# Patient Record
Sex: Female | Born: 1950 | Race: Black or African American | Hispanic: No | Marital: Married | Smoking: Current every day smoker
Health system: Southern US, Community
[De-identification: ages and names within clinical notes are randomized; demographics above are authoritative.]

## PROBLEM LIST (undated history)

## (undated) DIAGNOSIS — I10 Essential (primary) hypertension: Secondary | ICD-10-CM

## (undated) DIAGNOSIS — M1990 Unspecified osteoarthritis, unspecified site: Secondary | ICD-10-CM

## (undated) DIAGNOSIS — J302 Other seasonal allergic rhinitis: Secondary | ICD-10-CM

## (undated) HISTORY — PX: CARPAL TUNNEL RELEASE: SHX101

## (undated) HISTORY — PX: LUMBAR FUSION: SHX111

## (undated) HISTORY — PX: BACK SURGERY: SHX140

## (undated) HISTORY — PX: HAND TENDON SURGERY: SHX663

## (undated) HISTORY — PX: BRONCHOSCOPY: SUR163

## (undated) HISTORY — PX: ABDOMINAL HYSTERECTOMY: SHX81

## (undated) HISTORY — PX: COLONOSCOPY: SHX174

---

## 1993-03-23 HISTORY — PX: THORACOTOMY: SUR1349

## 2002-08-24 ENCOUNTER — Other Ambulatory Visit: Admission: RE | Admit: 2002-08-24 | Discharge: 2002-08-24 | Payer: Self-pay | Admitting: Unknown Physician Specialty

## 2005-01-20 ENCOUNTER — Encounter: Admission: RE | Admit: 2005-01-20 | Discharge: 2005-01-20 | Payer: Self-pay | Admitting: Neurological Surgery

## 2005-03-19 ENCOUNTER — Inpatient Hospital Stay (HOSPITAL_COMMUNITY): Admission: RE | Admit: 2005-03-19 | Discharge: 2005-03-21 | Payer: Self-pay | Admitting: Neurological Surgery

## 2005-04-03 ENCOUNTER — Encounter: Admission: RE | Admit: 2005-04-03 | Discharge: 2005-04-03 | Payer: Self-pay | Admitting: Neurological Surgery

## 2005-04-06 ENCOUNTER — Ambulatory Visit (HOSPITAL_COMMUNITY): Admission: RE | Admit: 2005-04-06 | Discharge: 2005-04-07 | Payer: Self-pay | Admitting: Neurological Surgery

## 2005-05-26 ENCOUNTER — Encounter: Admission: RE | Admit: 2005-05-26 | Discharge: 2005-05-26 | Payer: Self-pay | Admitting: Neurological Surgery

## 2006-12-06 ENCOUNTER — Encounter: Admission: RE | Admit: 2006-12-06 | Discharge: 2006-12-06 | Payer: Self-pay | Admitting: Neurological Surgery

## 2008-01-27 IMAGING — CR DG LUMBAR SPINE 2-3V
3 series · 3 of 3 positions shown · non-contrast
Comparison: CT of the lumbar spine of 04-03-05.

CLINICAL DATA: Status post fusion on [DATE].  Low back pain. 
 LUMBAR SPINE ? THREE VIEWS:

[view not recorded (1 of 3)]
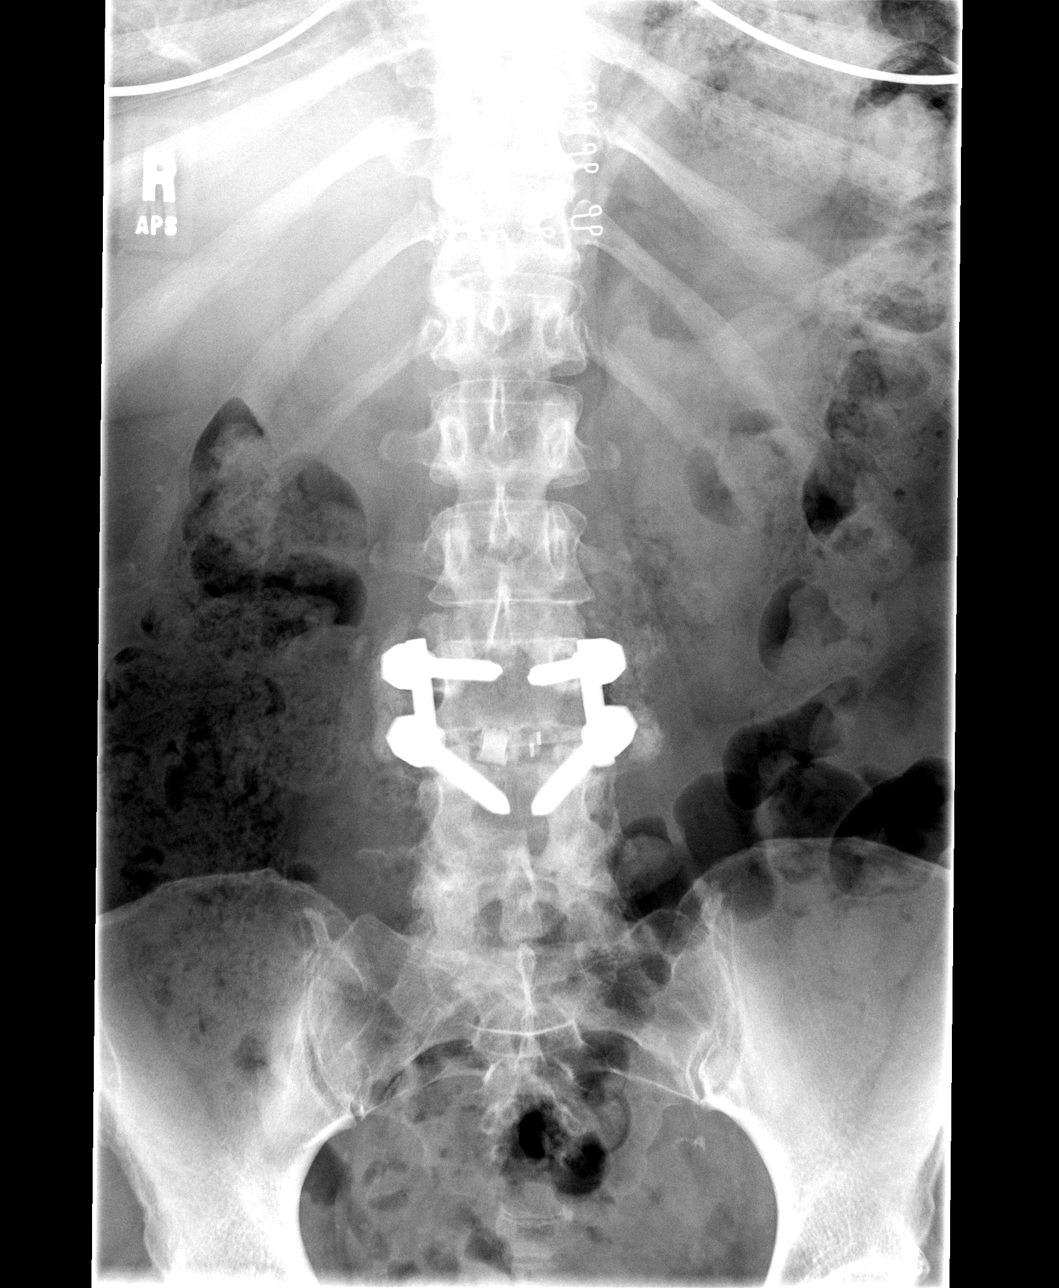

[view not recorded (2 of 3)]
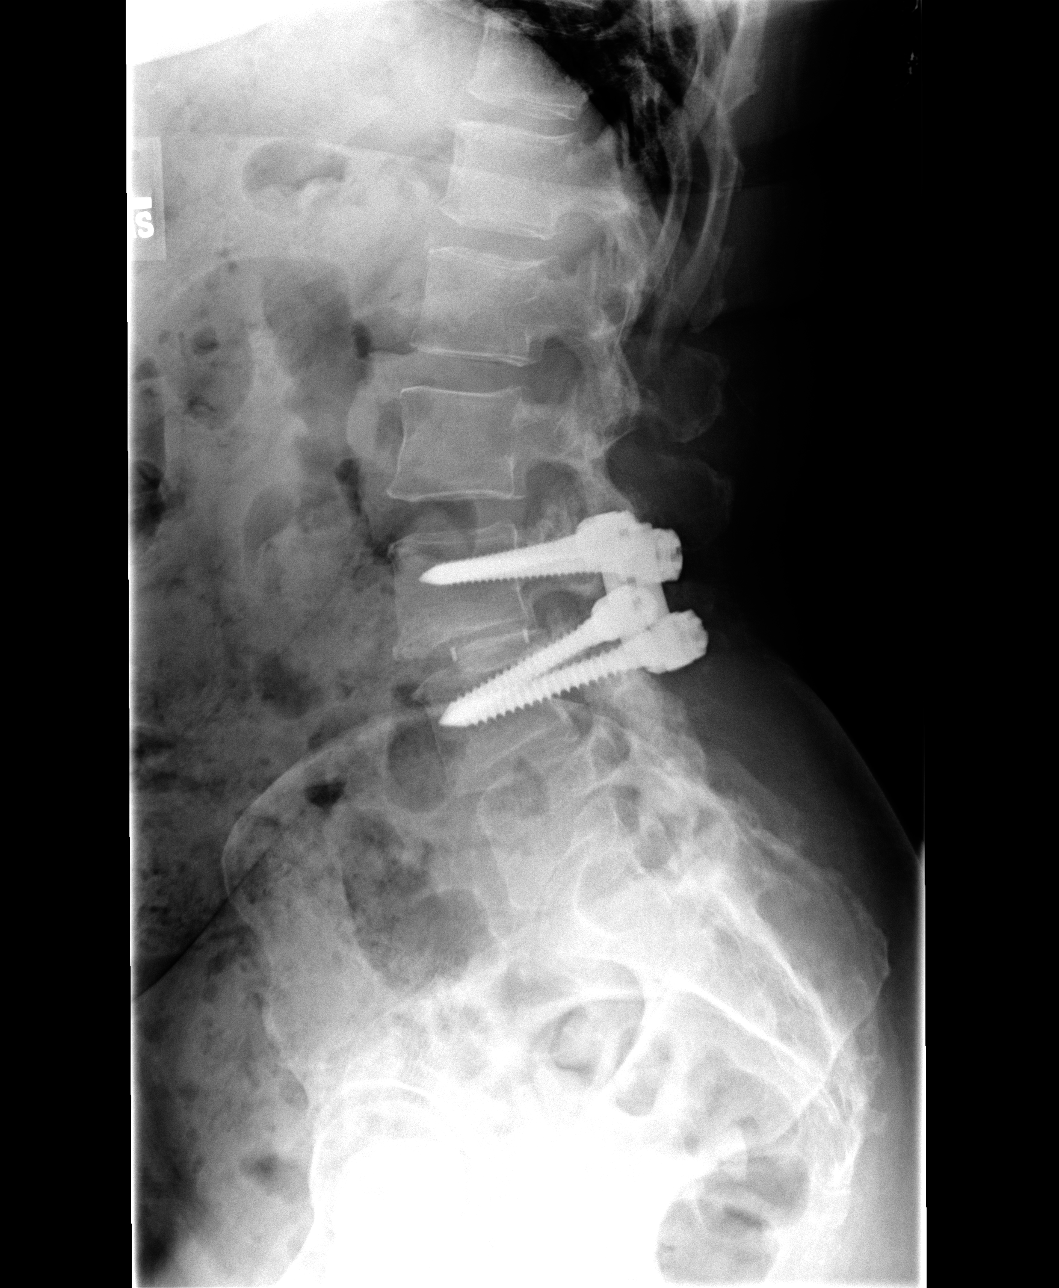

[view not recorded (3 of 3)]
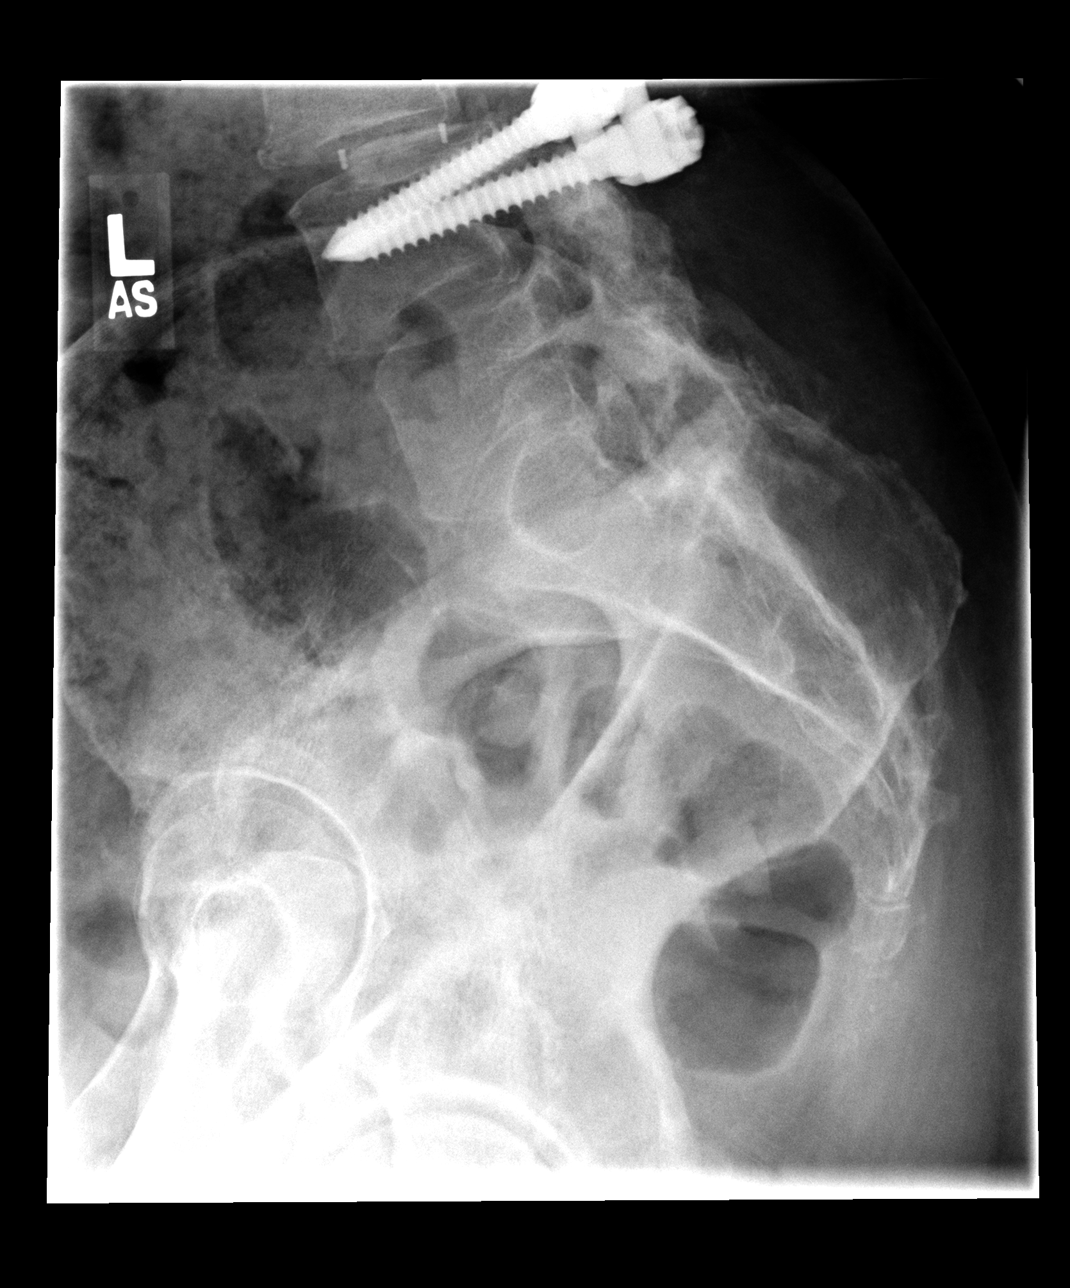

[3 of 3 positions shown; findings below may reference images not displayed]

FINDINGS: The patient is status post L3-4 posterior fixation.  The patient has also undergone prior L4-5 fixation. There is interbody graft material at L3-4.  There is no hardware complication identified.  Alignment is unchanged.
IMPRESSION: Stable plain films since 04-03-05.  The abnormality described on CT is not apparent by plain films.  No evidence of hardware loosening or other complication.

## 2009-08-08 IMAGING — CR DG LUMBAR SPINE 2-3V
3 series · 3 of 3 positions shown · non-contrast
Comparison: 05/26/05

CLINICAL DATA: Posterior lumbar interbody fusion with low back and left hip pain.  Left leg pain.  
 LUMBAR SPINE - 2 VIEW:

[view not recorded (1 of 3)]
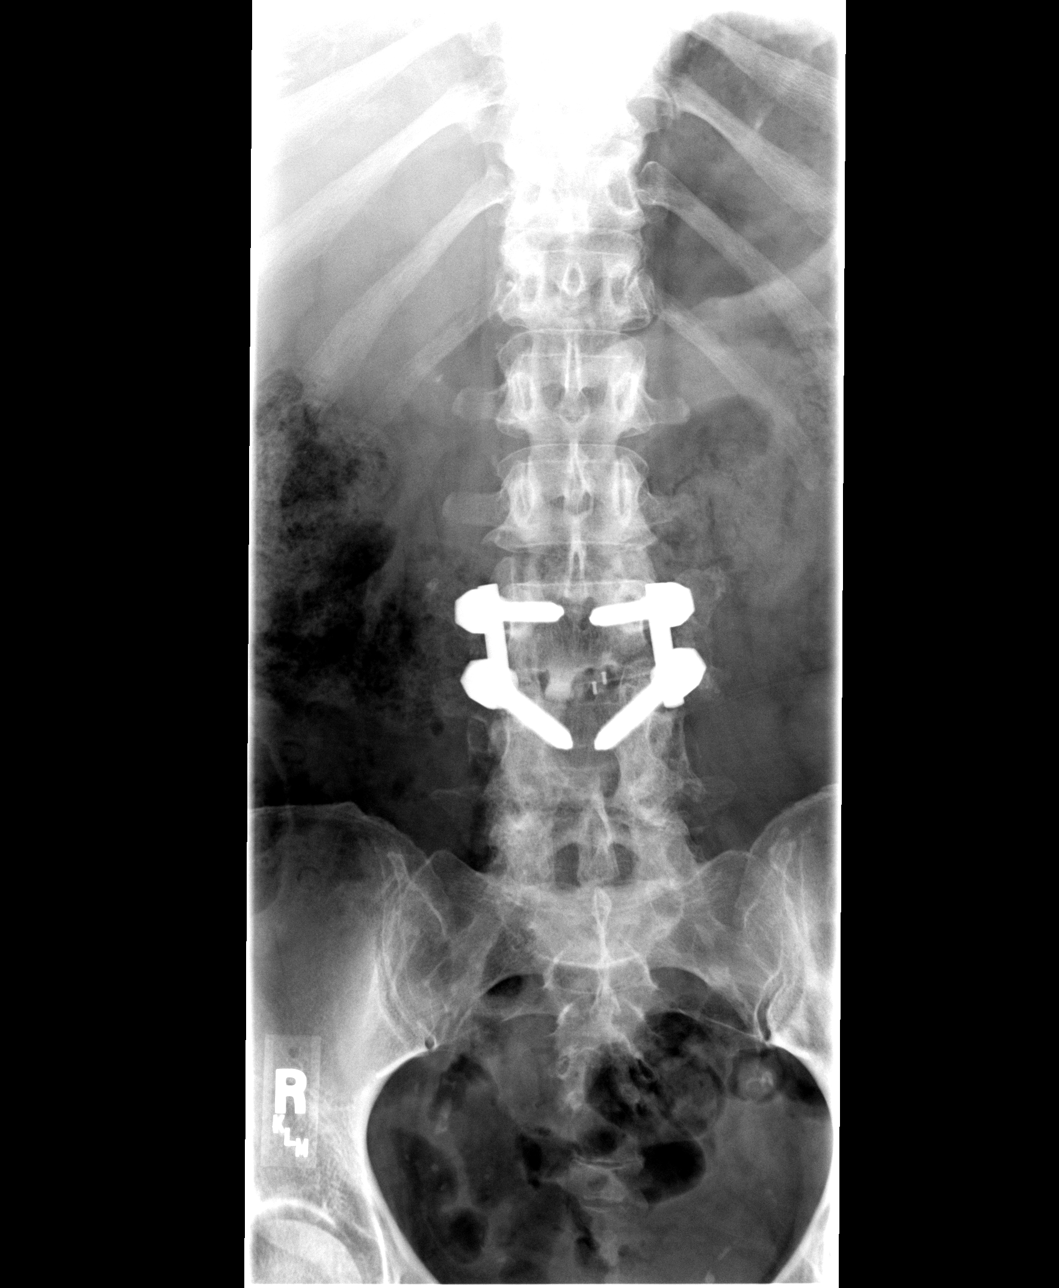

[view not recorded (2 of 3)]
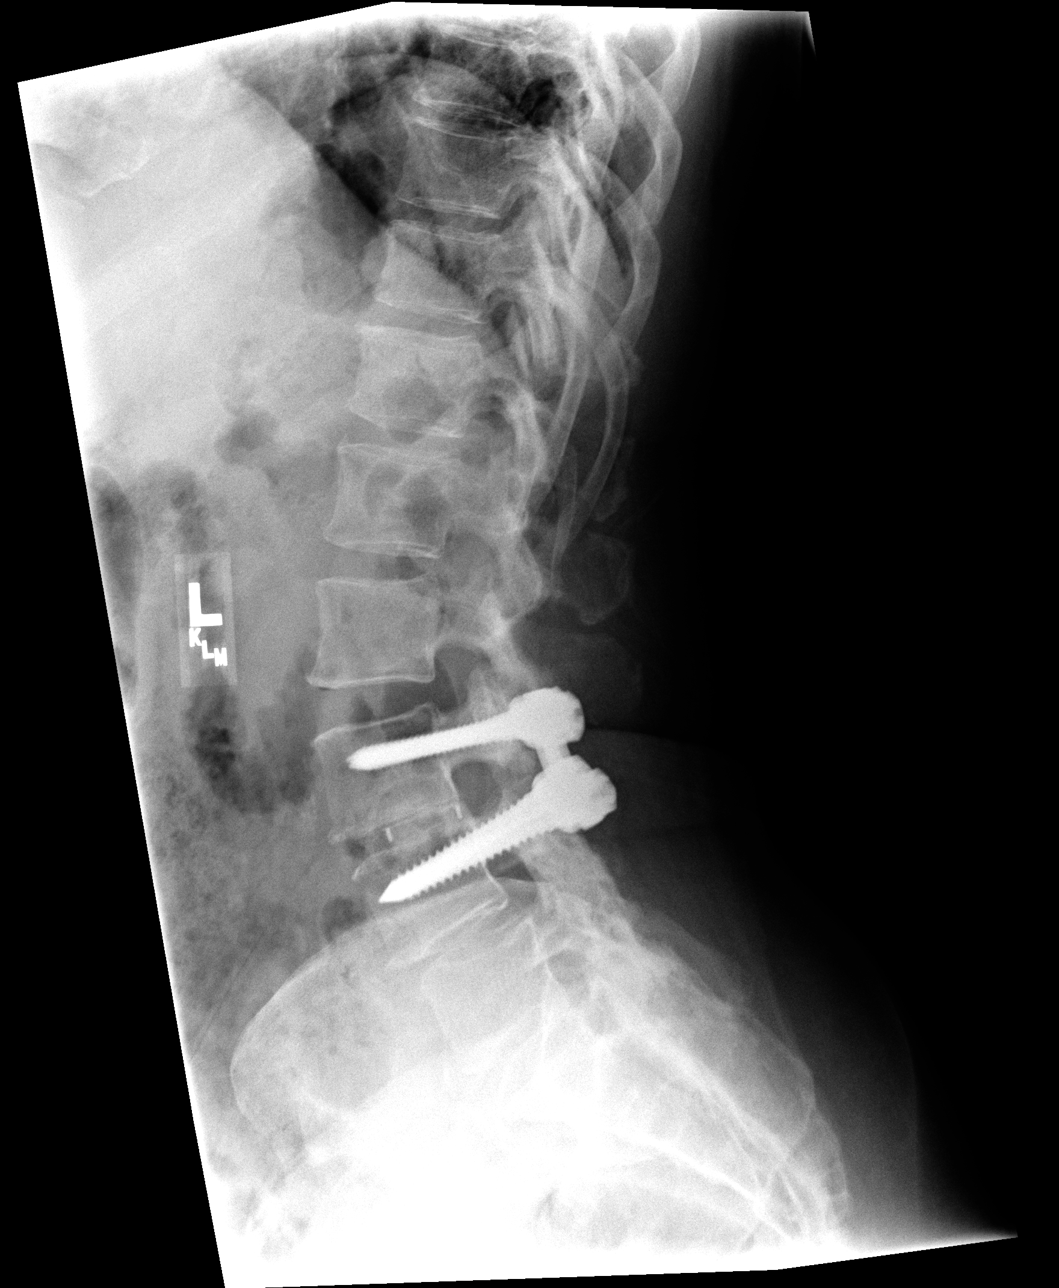

[view not recorded (3 of 3)]
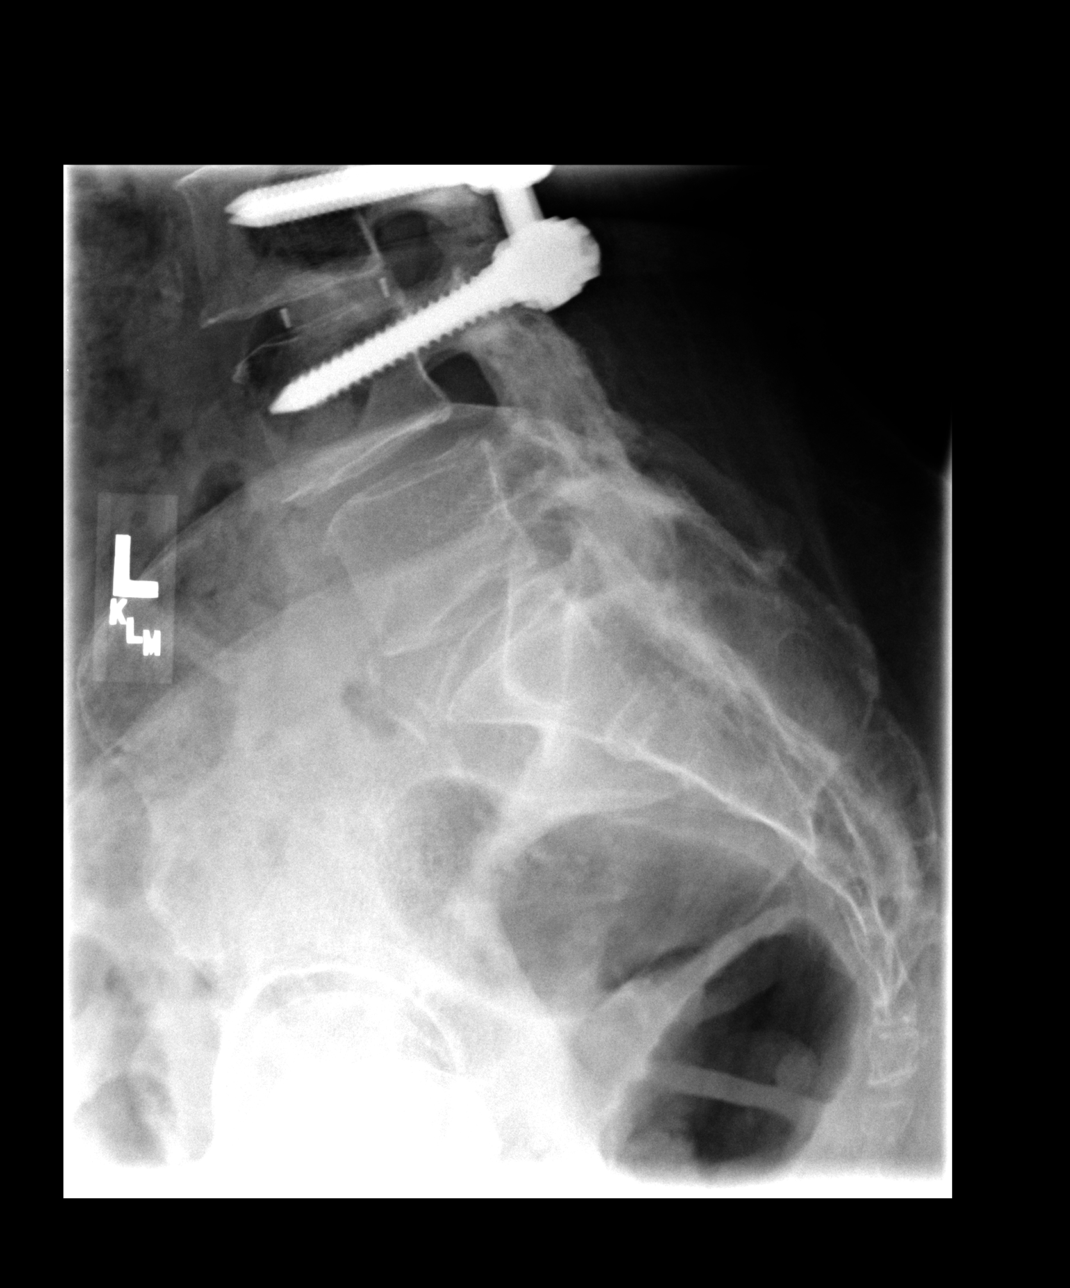

[3 of 3 positions shown; findings below may reference images not displayed]

FINDINGS: The patient is status post L3 - L4 posterior lumbar interbody fusion with interbody strut device.  There is lucency about the superior aspect of the interbody graft material.  Alignment of the lumbar spine is unchanged from 04/03/05.  There is loss of disk space height at L4-5.  Endplate degenerative change elsewhere.
IMPRESSION: 1.  L3 - L4 PLIF with lucency along the superior margin of the interbody graft material.  
 2.  Spondylosis.

## 2010-08-08 NOTE — Op Note (Signed)
Linda Davis, Linda Davis             ACCOUNT NO.:  [id number]   MEDICAL RECORD NO.:  [id number]          PATIENT TYPE:  INP   LOCATION:  2860                         FACILITY:  [facility]   PHYSICIAN:  Tia Alert, MD     DATE OF BIRTH:  Aug 22, 1950   DATE OF PROCEDURE:  03/19/2005  DATE OF DISCHARGE:                                 OPERATIVE REPORT   PREOPERATIVE DIAGNOSIS:  Adjacent level degeneration, L3-4, with segmental  instability and lumbar spinal stenosis, with back and leg pain.   POSTOPERATIVE DIAGNOSIS:  Adjacent level degeneration, L3-4, with segmental  instability and lumbar spinal stenosis, with back and leg pain.   PROCEDURES:  1.  Lumbar re-exploration with exploration of fusion, L4-5, with removal of      nonsegmental instrumentation, L4-5.  2.  Decompressive lumbar laminectomy, hemifacetectomy and foraminotomies at      L3-4 for L3 and L4 nerve root decompression.  3.  Posterior lumbar interbody fusion, L3-4, utilizing 9 mm interbody cage      packed with local autograft and Vitoss soaked with bone marrow aspirate      obtained through a separate stab incision and an interbody allograft at      L3-4.  4.  Intertransverse arthrodesis, L3-4, utilizing a mixture of Vitoss soaked      with bone marrow aspirate and local autograft.  5.  Nonsegmental fixation, L3-4, utilizing the USS pedicle screw system,      with replacement of hardware, L3-4.   SURGEON:  Tia Alert, M.D.   ASSISTANT:  Reinaldo Meeker, M.D.   ANESTHESIA:  General endotracheal.   COMPLICATIONS:  None apparent.   INDICATIONS FOR PROCEDURE:  Ms. Killough is a 60 year old black female who  had undergone an instrumented fusion at L4-5 at [facility] about five  years ago.  Postoperatively she developed significant back pain with left  leg pain in an L4 distribution.  She has had extensive workup at [facility] and at pain management centers and been treated with pain  medicines for  several years without significant relief.  I got a CT  myelogram with flexion-extension views that showed segmental instability at  L3-4, the level adjacent to the fusion, with adjacent-level stenosis at L3-4  and compression of the left L4 nerve root.  I recommended lumbar re-  exploration with removal of the hardware at L4-5, a PLIF at L3-4 with  replacement of hardware at L3-4.  She understood the risks, benefits and  expected outcome of the procedure and wished to proceed.   DESCRIPTION OF PROCEDURE:  The patient was taken to operating room and after  induction of adequate generalized endotracheal anesthesia, she was rolled  into the prone position on chest rolls and all pressure points were padded.  Her lumbar region was prepped with DuraPrep and then draped in the usual  sterile fashion.  Local anesthesia 10 mL was then injected and then a dorsal  midline incision was made through the old incision and carried down through  the lumbosacral fascia.  The fascia was opened and the paraspinous  musculature was  taken down in a subperiosteal fashion to expose L3-4 and L4-  5.  The old hardware was found as I dissected out over the facets at L3-4  and identified the transverse processes of L3 and L4 and identified the  heads of pedicle screws at L4 and L5.  I then was able to remove the locking  caps and the sleeves from the surface of the pedicle screws at L4 and L5.  These pedicle screws were quite tight in the pedicles and seen to be very  solid.  I explored the fusion at L4-5 and found it to be solid.  It moved as  one unit.  I then was able to remove the soft tissues from the L3 lamina and  used a Kerrison punch and a Leksell rongeur to perform a complete  laminectomy, hemifacetectomy and foraminotomy at L3-4.  The L3 and L4 nerve  roots were identified and decompressed into their respective foramina.  The  epidural venous vasculature was coagulated L3-4 and the disk space was  incised  at L3-4 bilaterally, and the initial diskectomy was done with  pituitary rongeurs.  The disk space was then distracted to 9 mm and the  endplates were prepared on both sides utilizing rotating cutters and  curettes.  Once the endplates were prepared, we packed a 9 x 22 mm PEEK  interbody cage with local autograft and Vitoss which was soaked with a bone  marrow aspirate, which was obtained through a separate fascial incision over  the right iliac crest.  The cage was packed and tapped into position at L3-4  on the patient's right side.  The midline was then packed with local  autograft and Vitoss soaked with bone marrow aspirate and a 9 mm allograft  was tapped down on the patient's left side.  Once the PLIF was completed, we  localized the pedicle screw entry zones at L3.  We probed each pedicle under  fluoroscopic guidance, tapped each pedicle with a 5.0 tap and placed 6.00 x  40 mm pedicle screws into the pedicles of L3 bilaterally.  We took out the  L5 pedicle screws bilaterally and then were able to slide the rod into the  pedicle screws, place the sleeve on top of the rod and connect the locking  nut.  These nuts were tightened with a hand tightener at L3 and L4  bilaterally.  We then decorticated the transverse 5 processes of L3 and L4  bilaterally and placed a mixture of Vitoss soaked with bone marrow aspirate  and local autograft out over these,  performing intertransverse arthrodesis  at L3-4 bilaterally.  We irrigated with copious amounts of bacitracin-  containing saline solution, placed a medium Hemovac drain through a separate  stab incision, lined the dura with Gelfoam, achieved hemostasis and then  closed the muscle and the fascia with interrupted #1 Vicryl and closed the  subcutaneous and subcuticular tissues with 200 and 3-0 Vicryl and closed the  skin with Benzoin and Steri-Strips.  The drapes were removed, a sterile dressing was applied, and the patient was awakened from  general anesthesia  and transported to the recovery room in stable condition.  At the end of the  procedure all sponge, needle and instrument counts were correct.      Tia Alert, MD  Electronically Signed     DSJ/MEDQ  D:  03/19/2005  T:  03/20/2005  Job:  [id number]

## 2010-08-08 NOTE — Op Note (Signed)
NAMEKYNSLEIGH, WESTENDORF             ACCOUNT NO.:  [id number]   MEDICAL RECORD NO.:  [id number]          PATIENT TYPE:  OIB   LOCATION:  3001                         FACILITY:  [facility]   PHYSICIAN:  Tia Alert, MD     DATE OF BIRTH:  12-04-1950   DATE OF PROCEDURE:  04/06/2005  DATE OF DISCHARGE:                                 OPERATIVE REPORT   PREOPERATIVE DIAGNOSIS:  Left L3 radiculopathy from bone graft compression  in the neural foramen L3-4 on the left, status post L3-4 PLIF.   POSTOPERATIVE DIAGNOSIS:  Left L3 radiculopathy from bone graft compression  in the neural foramen L3-4 on the left, status post L3-4 PLIF.   PROCEDURE:  Lumbar re-exploration with exploration of L3-4 fusion with  repeat foraminotomy and extension of hemi-facetectomy at L3-4 on the left  with decompression of the left L3 nerve root.   SURGEON:  Marikay Alar, M.D.   ASSISTANT:  Reinaldo Meeker, M.D.   ANESTHESIA:  General tracheal.   COMPLICATIONS:  None apparent.   INDICATIONS FOR PROCEDURE:  Ms. Fosco is a 60 year old female who  underwent a posterior lumbar antibody fusion about 2-1/2 weeks ago.  She  presented with severe left leg pain and following the L3 distribution.  I  thought a plane film showed a density in the foramen and therefore ordered a  CT scan.  This showed what looked like of pieces of bone graft in the  foramen at L3-4 on the left side lateral to the pedicle screw construct  suggestive of a bone graft laid out during the intertransverse arthrodesis  that had migrated into the foramen of the left L3 nerve root.  This seemed  to match her symptomatology.  The radiologist felt that the foramen was  opened.  Despite this, however, I felt that it matched her symptomatology.  I recommended lumbar re-exploration with exploration of the foramen with  decompression of the left L3 nerve root.  She understood the risks,  benefits, expected outcome and wished to proceed.   FINDINGS  AT OPERATION:  VITOSS allograft bone matrix lateral to the pedicle  screw and rod construct in the extraforaminal space, compressing the left L3  nerve root.   DESCRIPTION OF PROCEDURE:  The patient was taken to the operating room and  after induction of adequate generalized endotracheal anesthesia, she was  rolled in the prone position on the Wilson frame, and all pressure points  were padded.  The lumbar region was prepped with DuraPrep and then draped in  the usual sterile fashion; 5 mL of  local anesthesia was injected, and then  an incision was made through the upper half of the old incision and carried  down to the fascia.  The old sutures were cut, and the fascia was opened,  and the hardware was identified.  The hardware was well placed and was  solid.  I was able to suck away the old Gelfoam and some scar tissue from  the surface of the dura and identify the left L3 and L4 nerve roots.  I  identified the peak of the  interbody graft.  I was able to remove some disk  space and some disk from the disk space lateral to the peak graft to  decompress the L3 nerve root from underneath.  I was then able to follow the  L3 nerve root into the foramen.  I was able to use a Kerrison punch to widen  the hemi-facetectomy and follow the nerve out under the pedicle screw and  rod construct.  There I found a significant amount of VITOSS which had come  down from the intertransverse space during packing of disk space to perform  intertransverse arthrodesis during the previous surgery.  This was  compressing the L3 nerve root significantly.  I was able to use a nerve hook  to remove this and remove several fragments of VITOSS from the surface of  the nerve root.  I continued to follow the nerve root out into the foramen  until it was well decompressed.  I then continued to remove pieces of the  VITOSS from underneath the rod and pedicle screw construct to make sure  there was a significant space at  least 2 cm between the nerve root and the  VITOSS superiorly.  I felt I had a good decompression of the nerve root.  I  then irrigated with saline solution containing bacitracin, inspected the  nerve root once again, put a nerve hook and a coronary dilator.  I inspected  my rod construct once again.  I then lined the dura with Gelfoam and then  closed the fascia with interrupted #1 Vicryl and closed the subcutaneous and  subcuticular tissues with 3-0 Vicryl and closed the skin with Benzoin Steri-  Strips.  The drapes were removed. Sterile dressing was applied.  The patient  was awakened from anesthesia and transferred to the recovery room in stable  condition.  At the end of procedure, all sponge, needle and sponge counts  were correct.      Tia Alert, MD  Electronically Signed     DSJ/MEDQ  D:  04/06/2005  T:  04/07/2005  Job:  [id number]

## 2011-06-25 ENCOUNTER — Encounter (HOSPITAL_BASED_OUTPATIENT_CLINIC_OR_DEPARTMENT_OTHER)
Admission: RE | Admit: 2011-06-25 | Discharge: 2011-06-25 | Disposition: A | Discharge status: Home / Self Care | Payer: Medicare Other | Source: Ambulatory Visit | Attending: Orthopedic Surgery | Admitting: Orthopedic Surgery

## 2011-06-25 ENCOUNTER — Encounter (HOSPITAL_BASED_OUTPATIENT_CLINIC_OR_DEPARTMENT_OTHER): Payer: Self-pay | Admitting: *Deleted

## 2011-06-25 NOTE — Progress Notes (Signed)
Came from office-too early to do lab-ekg done-will do istat dos-lives past [place]. Sees a cardiologist yearly only due to htn and diabetic-will call for records

## 2011-06-26 ENCOUNTER — Other Ambulatory Visit: Payer: Self-pay | Admitting: Orthopedic Surgery

## 2011-07-01 ENCOUNTER — Encounter (HOSPITAL_BASED_OUTPATIENT_CLINIC_OR_DEPARTMENT_OTHER): Payer: Self-pay | Admitting: Certified Registered Nurse Anesthetist

## 2011-07-01 ENCOUNTER — Ambulatory Visit (HOSPITAL_BASED_OUTPATIENT_CLINIC_OR_DEPARTMENT_OTHER): Payer: Medicare Other | Admitting: Certified Registered Nurse Anesthetist

## 2011-07-01 ENCOUNTER — Encounter (HOSPITAL_BASED_OUTPATIENT_CLINIC_OR_DEPARTMENT_OTHER): Payer: Self-pay | Admitting: Orthopedic Surgery

## 2011-07-01 ENCOUNTER — Encounter (HOSPITAL_BASED_OUTPATIENT_CLINIC_OR_DEPARTMENT_OTHER)
Admission: RE | Disposition: A | Discharge status: Home / Self Care | Payer: Self-pay | Source: Ambulatory Visit | Attending: Orthopedic Surgery

## 2011-07-01 ENCOUNTER — Ambulatory Visit (HOSPITAL_BASED_OUTPATIENT_CLINIC_OR_DEPARTMENT_OTHER)
Admission: RE | Admit: 2011-07-01 | Discharge: 2011-07-01 | Disposition: A | Discharge status: Home / Self Care | Payer: Medicare Other | Source: Ambulatory Visit | Attending: Orthopedic Surgery | Admitting: Orthopedic Surgery

## 2011-07-01 DIAGNOSIS — M9380 Other specified osteochondropathies of unspecified site: Secondary | ICD-10-CM | POA: Insufficient documentation

## 2011-07-01 DIAGNOSIS — E119 Type 2 diabetes mellitus without complications: Secondary | ICD-10-CM | POA: Insufficient documentation

## 2011-07-01 DIAGNOSIS — Z01810 Encounter for preprocedural cardiovascular examination: Secondary | ICD-10-CM | POA: Insufficient documentation

## 2011-07-01 DIAGNOSIS — I10 Essential (primary) hypertension: Secondary | ICD-10-CM | POA: Insufficient documentation

## 2011-07-01 HISTORY — DX: Essential (primary) hypertension: I10

## 2011-07-01 HISTORY — PX: CARPECTOMY: SHX5004

## 2011-07-01 HISTORY — DX: Unspecified osteoarthritis, unspecified site: M19.90

## 2011-07-01 HISTORY — DX: Other seasonal allergic rhinitis: J30.2

## 2011-07-01 LAB — POCT I-STAT, CHEM 8
Calcium, Ion: 1.24 mmol/L (ref 1.12–1.32)
Creatinine, Ser: 0.8 mg/dL (ref 0.50–1.10)
Glucose, Bld: 227 mg/dL — ABNORMAL HIGH (ref 70–99)
HCT: 49 % — ABNORMAL HIGH (ref 36.0–46.0)
Hemoglobin: 16.7 g/dL — ABNORMAL HIGH (ref 12.0–15.0)
TCO2: 26 mmol/L (ref 0–100)

## 2011-07-01 SURGERY — CARPECTOMY
Anesthesia: General | Site: Wrist | Laterality: Left | Wound class: Clean

## 2011-07-01 MED ORDER — METOCLOPRAMIDE HCL 5 MG/ML IJ SOLN: 10.0000 mg | Freq: Once | INTRAMUSCULAR | Status: DC | PRN

## 2011-07-01 MED ORDER — LACTATED RINGERS IV SOLN
INTRAVENOUS | Status: DC
  Administered 2011-07-01 (×2): via INTRAVENOUS

## 2011-07-01 MED ORDER — CHLORHEXIDINE GLUCONATE 4 % EX LIQD: 60.0000 mL | Freq: Once | CUTANEOUS | Status: DC

## 2011-07-01 MED ORDER — LIDOCAINE-EPINEPHRINE 1.5-1:200000 % IJ SOLN
INTRAMUSCULAR | Status: DC | PRN
  Administered 2011-07-01: 20 mL via INTRADERMAL

## 2011-07-01 MED ORDER — LIDOCAINE HCL 1 % IJ SOLN
INTRAMUSCULAR | Status: DC | PRN
  Administered 2011-07-01: 3 mL via INTRADERMAL

## 2011-07-01 MED ORDER — FENTANYL CITRATE 0.05 MG/ML IJ SOLN
50.0000 ug | INTRAMUSCULAR | Status: DC | PRN
  Administered 2011-07-01: 100 ug via INTRAVENOUS

## 2011-07-01 MED ORDER — ROPIVACAINE HCL 5 MG/ML IJ SOLN
INTRAMUSCULAR | Status: DC | PRN
  Administered 2011-07-01: 20 mL via EPIDURAL

## 2011-07-01 MED ORDER — DROPERIDOL 2.5 MG/ML IJ SOLN
INTRAMUSCULAR | Status: DC | PRN
  Administered 2011-07-01: 0.625 mg via INTRAVENOUS

## 2011-07-01 MED ORDER — PROPOFOL 10 MG/ML IV EMUL
INTRAVENOUS | Status: DC | PRN
  Administered 2011-07-01: 240 mg via INTRAVENOUS

## 2011-07-01 MED ORDER — FENTANYL CITRATE 0.05 MG/ML IJ SOLN: 25.0000 ug | INTRAMUSCULAR | Status: DC | PRN

## 2011-07-01 MED ORDER — METOCLOPRAMIDE HCL 5 MG/ML IJ SOLN
INTRAMUSCULAR | Status: DC | PRN
  Administered 2011-07-01 (×2): 10 mg via INTRAVENOUS

## 2011-07-01 MED ORDER — LIDOCAINE HCL (CARDIAC) 20 MG/ML IV SOLN
INTRAVENOUS | Status: DC | PRN
  Administered 2011-07-01: 60 mg via INTRAVENOUS

## 2011-07-01 MED ORDER — MIDAZOLAM HCL 2 MG/2ML IJ SOLN
0.5000 mg | INTRAMUSCULAR | Status: DC | PRN
  Administered 2011-07-01: 2 mg via INTRAVENOUS

## 2011-07-01 MED ORDER — EPHEDRINE SULFATE 50 MG/ML IJ SOLN
INTRAMUSCULAR | Status: DC | PRN
  Administered 2011-07-01: 10 mg via INTRAVENOUS

## 2011-07-01 MED ORDER — CEFAZOLIN SODIUM 1-5 GM-% IV SOLN
1.0000 g | INTRAVENOUS | Status: AC
  Administered 2011-07-01: 2 g via INTRAVENOUS

## 2011-07-01 MED ORDER — PENTAZOCINE-NALOXONE 50-0.5 MG PO TABS: 1.0000 | ORAL_TABLET | ORAL | Status: AC | PRN

## 2011-07-01 MED ORDER — ONDANSETRON HCL 4 MG/2ML IJ SOLN
INTRAMUSCULAR | Status: DC | PRN
  Administered 2011-07-01: 4 mg via INTRAVENOUS

## 2011-07-01 SURGICAL SUPPLY — 55 items
BANDAGE GAUZE ELAST BULKY 4 IN (GAUZE/BANDAGES/DRESSINGS) ×2 IMPLANT
BLADE ARTHRO LOK 4 BEAVER (BLADE) ×2 IMPLANT
BLADE EAR TYMPAN 2.5 60D BEAV (BLADE) IMPLANT
BLADE MINI RND TIP GREEN BEAV (BLADE) ×2 IMPLANT
BLADE SURG 15 STRL LF DISP TIS (BLADE) ×1 IMPLANT
BLADE SURG 15 STRL SS (BLADE) ×2
BNDG CMPR 9X4 STRL LF SNTH (GAUZE/BANDAGES/DRESSINGS) ×1
BNDG COHESIVE 3X5 TAN STRL LF (GAUZE/BANDAGES/DRESSINGS) ×2 IMPLANT
BNDG ESMARK 4X9 LF (GAUZE/BANDAGES/DRESSINGS) ×2 IMPLANT
CHLORAPREP W/TINT 26ML (MISCELLANEOUS) ×2 IMPLANT
CLOTH BEACON ORANGE TIMEOUT ST (SAFETY) ×2 IMPLANT
CORDS BIPOLAR (ELECTRODE) ×2 IMPLANT
COVER MAYO STAND STRL (DRAPES) ×2 IMPLANT
COVER TABLE BACK 60X90 (DRAPES) ×2 IMPLANT
CUFF TOURNIQUET SINGLE 18IN (TOURNIQUET CUFF) ×1 IMPLANT
DECANTER SPIKE VIAL GLASS SM (MISCELLANEOUS) IMPLANT
DRAPE EXTREMITY T 121X128X90 (DRAPE) ×2 IMPLANT
DRAPE OEC MINIVIEW 54X84 (DRAPES) ×2 IMPLANT
DRAPE SURG 17X23 STRL (DRAPES) ×2 IMPLANT
GAUZE XEROFORM 1X8 LF (GAUZE/BANDAGES/DRESSINGS) ×2 IMPLANT
GLOVE BIO SURGEON STRL SZ 6.5 (GLOVE) ×2 IMPLANT
GLOVE BIOGEL PI IND STRL 7.0 (GLOVE) IMPLANT
GLOVE BIOGEL PI IND STRL 8.5 (GLOVE) ×1 IMPLANT
GLOVE BIOGEL PI INDICATOR 7.0 (GLOVE) ×1
GLOVE BIOGEL PI INDICATOR 8.5 (GLOVE) ×1
GLOVE SURG ORTHO 8.0 STRL STRW (GLOVE) ×2 IMPLANT
GLOVE SURG SS PI 8.5 STRL IVOR (GLOVE)
GLOVE SURG SS PI 8.5 STRL STRW (GLOVE) ×1 IMPLANT
GOWN BRE IMP PREV XXLGXLNG (GOWN DISPOSABLE) ×2 IMPLANT
GOWN PREVENTION PLUS XLARGE (GOWN DISPOSABLE) ×2 IMPLANT
NS IRRIG 1000ML POUR BTL (IV SOLUTION) ×2 IMPLANT
PACK BASIN DAY SURGERY FS (CUSTOM PROCEDURE TRAY) ×2 IMPLANT
PAD CAST 3X4 CTTN HI CHSV (CAST SUPPLIES) ×1 IMPLANT
PADDING CAST ABS 3INX4YD NS (CAST SUPPLIES)
PADDING CAST ABS 4INX4YD NS (CAST SUPPLIES)
PADDING CAST ABS COTTON 3X4 (CAST SUPPLIES) IMPLANT
PADDING CAST ABS COTTON 4X4 ST (CAST SUPPLIES) ×1 IMPLANT
PADDING CAST COTTON 3X4 STRL (CAST SUPPLIES) ×2
SLEEVE SCD COMPRESS KNEE MED (MISCELLANEOUS) ×2 IMPLANT
SPLINT PLASTER CAST XFAST 3X15 (CAST SUPPLIES) IMPLANT
SPLINT PLASTER XTRA FASTSET 3X (CAST SUPPLIES) ×10
SPONGE GAUZE 4X4 12PLY (GAUZE/BANDAGES/DRESSINGS) ×2 IMPLANT
STOCKINETTE 4X48 STRL (DRAPES) ×2 IMPLANT
SUT MERSILENE 3 0 FS 1 (SUTURE) ×2 IMPLANT
SUT VIC AB 0 CT1 27 (SUTURE)
SUT VIC AB 0 CT1 27XBRD ANBCTR (SUTURE) IMPLANT
SUT VIC AB 2-0 SH 27 (SUTURE)
SUT VIC AB 2-0 SH 27XBRD (SUTURE) IMPLANT
SUT VICRYL 4-0 PS2 18IN ABS (SUTURE) ×2 IMPLANT
SUT VICRYL RAPIDE 4/0 PS 2 (SUTURE) ×2 IMPLANT
SYR BULB 3OZ (MISCELLANEOUS) ×2 IMPLANT
SYR CONTROL 10ML LL (SYRINGE) IMPLANT
TOWEL OR 17X24 6PK STRL BLUE (TOWEL DISPOSABLE) ×2 IMPLANT
UNDERPAD 30X30 INCONTINENT (UNDERPADS AND DIAPERS) ×2 IMPLANT
WATER STERILE IRR 1000ML POUR (IV SOLUTION) ×1 IMPLANT

## 2011-07-01 NOTE — Op Note (Signed)
Dictated 07-01-2011

## 2011-07-01 NOTE — Anesthesia Preprocedure Evaluation (Signed)
Anesthesia Evaluation  Patient identified by MRN, date of birth, ID band Patient awake    Reviewed: Allergy & Precautions, H&P , NPO status , Patient's Chart, lab work & pertinent test results, reviewed documented beta blocker date and time   Airway Mallampati: II TM Distance: >3 FB Neck ROM: full    Dental   Pulmonary neg pulmonary ROS,          Cardiovascular hypertension, On Medications     Neuro/Psych negative neurological ROS  negative psych ROS   GI/Hepatic negative GI ROS, Neg liver ROS,   Endo/Other  Diabetes mellitus-, Type 2, Oral Hypoglycemic Agents  Renal/GU negative Renal ROS  negative genitourinary   Musculoskeletal   Abdominal   Peds  Hematology negative hematology ROS (+)   Anesthesia Other Findings See surgeon's H&P   Reproductive/Obstetrics negative OB ROS                           Anesthesia Physical Anesthesia Plan  ASA: III  Anesthesia Plan: General   Post-op Pain Management:    Induction: Intravenous  Airway Management Planned: LMA  Additional Equipment:   Intra-op Plan:   Post-operative Plan: Extubation in OR  Informed Consent: I have reviewed the patients History and Physical, chart, labs and discussed the procedure including the risks, benefits and alternatives for the proposed anesthesia with the patient or authorized representative who has indicated his/her understanding and acceptance.     Plan Discussed with: CRNA and Surgeon  Anesthesia Plan Comments:         Anesthesia Quick Evaluation

## 2011-07-01 NOTE — Op Note (Signed)
Linda Davis, Linda Davis             ACCOUNT NO.:  [id number]  MEDICAL RECORD NO.:  [id number]  LOCATION:                                 FACILITY:  PHYSICIAN:  Cindee Salt, M.D.            DATE OF BIRTH:  DATE OF PROCEDURE:  07/01/2011 DATE OF DISCHARGE:                              OPERATIVE REPORT   PREOPERATIVE DIAGNOSIS:  Stage IIIB Kienbock, left wrist.  POSTOPERATIVE DIAGNOSIS:  Stage IIIB Kienbock, left wrist.  OPERATION:  Proximal row carpectomy with radial styloidectomy, left wrist.  SURGEON:  Cindee Salt, M.D.  ANESTHESIA:  Axillary general.  ANESTHESIOLOGIST:  Janetta Hora. Gelene Mink, M.D.  HISTORY:  The patient is a 61 year old female with a history of swelling, pain in her left wrist.  X-rays reveal Kienbock disease.  CT scan reveals fragmentation of the lunate with collapse, rotation of the scaphoid.  We have discussed various treatment alternatives.  She has elected to undergo proximal row carpectomy with possible styloidectomy as a treatment, being aware that this may not be the final procedure necessary that a complete wrist fusion may be the eventuation of treatment for her disease process.  She is aware that there is no guarantee with the surgery, possibility of infection, recurrence, injury to arteries, nerves, tendons, incomplete relief of symptoms, dystrophy. In the preoperative area, the patient is seen, the extremity marked by both the patient and surgeon.  Antibiotic given.  PROCEDURE:  The patient was brought to the operating room and an axillary block was carried out without difficulty under the direction Dr. Gelene Mink along with general anesthetic.  She was prepped using ChloraPrep, supine position, left arm free.  A 3-minute dry time was allowed.  Time-out taken, confirming the patient and procedure.  A longitudinal incision was made over the dorsal aspect of her wrist carried down through subcutaneous tissue.  Bleeders were electrocauterized with  bipolar.  Neurovascular structures identified and protected.  Retractors placed.  An incision made between the third and fourth dorsal compartment, extensor retinaculum, allowing visualization of the dorsal carpus.  An incision was then made distally over the capitate, carried proximally.  The proximal capitate showed good cartilage.  This was carried proximally to the level of the lunate and a T was then formed in the dorsal capsule leaving a cuff of tissue proximally across the entire capsule and protecting the dorsal radiocarpal ligament as much as possible.  The dissection then was carried deep by cutting the dorsal attachments to the scaphoid lunate and triquetrum with a Beaver blade.  The scapholunate ligament was partially torn, this was then cut, the scaphoid was then removed in a piecemeal manner protecting the radioscaphocapitate ligament.  This was done with a curettes and rongeurs.  The triquetrum was resected.  Next, the lunotriquetral ligament was transected with blunt and sharp dissection.  The triquetrum was also removed.  The lunate was fragmented, was very soft in nature, this was removed with a rongeur and curette, and this portion was sent to Pathology.  The wound was copiously irrigated with saline.  The radial styloid did appear to impinge on the trapezium and as such, this was isolated and styloidectomy performed  with a rongeur until no further impingement was noted.  The wound was again irrigated.  X-rays in AP and lateral direction revealed that the capitate proximally translocated into the lunate facet.  The capsule was then closed with figure-of-eight 3-0 Mersilene sutures in a pants-over-vest manner, stabilizing the dorsal capsule.  The retinaculum was then repaired with figure-of-eight 3-0 Vicryl sutures, subcutaneous tissue with interrupted 3-0 Vicryl, and the skin with a subcuticular 4-0 Vicryl Rapide suture.  A sterile compressive dressing and dorsal palmar  thumb splint was applied.  X-rays with the splint on revealed that the capitate lied in the lunate facet. On deflation of the tourniquet, all fingers immediately pinked and she was taken to the recovery room for observation.          ______________________________ Cindee Salt, M.D.     GK/MEDQ  D:  07/01/2011  T:  07/01/2011  Job:  [id number]  cc:   Linda Greenland, MD

## 2011-07-01 NOTE — H&P (Signed)
:   Linda Davis is a 61 year-old right-hand dominant female referred by Dr. Abran Duke for consultation for pain in her left wrist.  This has been going on for approximately six months.  She had an injection given which gave her minimal relief. She had a carpal tunnel release done in 1986.  She is disabled due to back surgery. She complains of pain of entire wrist and does not localize this well.  She states it is moderate aching in nature with a feeling of weakness, gradually getting worse. Activity makes it worse. Rest makes it better. She has history of diabetes, no history of thyroid problem, arthritis or gout.  There is no history of any of these in her family.  She has been wearing a splint for support. This is basically an ACE wrap.   ALLERGIES:   Morphine  MEDICATIONS:    Glipizide, metformin, HCTZ, Bayer aspirin.  SURGICAL HISTORY:    No other than listed.  FAMILY MEDICAL HISTORY:   Positive for diabetes, otherwise negative.  SOCIAL HISTORY:    She smokes one pack a week and is advised to quit and the reasons behind this. She does not drink.  REVIEW OF SYSTEMS:    Positive for glasses, high blood pressure, headaches, otherwise negative 14 points. Linda Davis is an 61 y.o. female.   Chief Complaint: wrist pain lt HPI: see above  Past Medical History  Diagnosis Date  . Hypertension   . Diabetes mellitus   . Arthritis   . Seasonal allergies     Past Surgical History  Procedure Date  . Lumbar fusion 2006,2007,1985,1990  . Back surgery     x4  . Carpal tunnel release     both rt/lt  . Abdominal hysterectomy   . Thoracotomy 1995    rt-scar tissue from pneumonia  . Colonoscopy   . Hand tendon surgery     rt   . Bronchoscopy     No family history on file. Social History:  reports that she has been smoking.  She does not have any smokeless tobacco history on file. She reports that she does not drink alcohol or use illicit drugs.  Allergies:  Allergies  Allergen  Reactions  . Morphine And Related Swelling    No current facility-administered medications on file as of 07/01/2011.   No current outpatient prescriptions on file as of 07/01/2011.    No results found for this or any previous visit (from the past 48 hour(s)).  No results found.   Pertinent items are noted in HPI.  Height 5\' 2"  (1.575 m), weight 74.39 kg (164 lb).  General appearance: alert, cooperative and appears stated age Head: Normocephalic, without obvious abnormality Neck: no adenopathy Resp: clear to auscultation bilaterally Cardio: regular rate and rhythm, S1, S2 normal, no murmur, click, rub or gallop GI: soft, non-tender; bowel sounds normal; no masses,  no organomegaly Extremities: extremities normal, atraumatic, no cyanosis or edema Pulses: 2+ and symmetric Skin: Skin color, texture, turgor normal. No rashes or lesions Neurologic: Grossly normal Incision/Wound: na  Assessment/Plan She has had her CT scan done and this reveals a stage IIIB Kienbock's with total lack of the lunate, significant widening of the scapholunate ligament complex. We have discussed this with her. We have discussed various alternatives including a complete wrist fusion, a denervation, a proximal row carpectomy. The capitate appears to be intact. As such we would recommend a proximal row carpectomy, possible styloidectomy. She would like to proceed with the proximal  row carpectomy. The pre, peri and post op course are discussed along with risks and complications. She is aware there is no guarantee with surgery, possibility of infection, recurrence, injury to arteries, nerves and tendons, incomplete relief of symptoms and dystrophy.  She has elected to proceed to have a proximal row carpectomy with radial styloidectomy performed.   It is noted on her x-rays there are degenerative changes of the radioscaphoid articulation and as such a styloidectomy may be necessary. She is aware there is potential for  denervation as a later procedure prior to complete wrist fusion in an effort to maintain mobility. She is aware of the potential for degenerative changes at the radiocapitate joint. Questions were invited and answered in detail. We will proceed with proximal row carpectomy, possible styloidectomy left wrist as an outpatient.  Linda Davis R 07/01/2011, 7:34 AM

## 2011-07-01 NOTE — Brief Op Note (Signed)
07/01/2011  9:48 AM  PATIENT:  Marice Potter  61 y.o. female  PRE-OPERATIVE DIAGNOSIS:  Cristobal Goldmann LEFT WRIST  POST-OPERATIVE DIAGNOSIS:  KIENBOCKS LEFT WRIST  PROCEDURE:  Procedure(s) (LRB): CARPECTOMY (Left)  SURGEON:  Surgeon(s) and Role:    * Nicki Reaper, MD - Primary  PHYSICIAN ASSISTANT:   ASSISTANTS: none   ANESTHESIA:   regional and general  EBL:  Total I/O In: 1000 [I.V.:1000] Out: -   BLOOD ADMINISTERED:none  DRAINS: none   LOCAL MEDICATIONS USED:  NONE  SPECIMEN:  Excision  DISPOSITION OF SPECIMEN:  PATHOLOGY  COUNTS:  YES  TOURNIQUET:   Total Tourniquet Time Documented: Upper Arm (Left) - 45 minutes  DICTATION: .Other Dictation: Dictation Number number lost 07-01-2011  PLAN OF CARE: Discharge to home after PACU  PATIENT DISPOSITION:  PACU - hemodynamically stable.

## 2011-07-01 NOTE — Progress Notes (Signed)
Assisted Dr. Frederick with left, ultrasound guided, axillary block. Side rails up, monitors on throughout procedure. See vital signs in flow sheet. Tolerated Procedure well. 

## 2011-07-01 NOTE — Anesthesia Procedure Notes (Addendum)
Anesthesia Regional Block:  Axillary brachial plexus block  Pre-Anesthetic Checklist: ,, timeout performed, Correct Patient, Correct Site, Correct Laterality, Correct Procedure, Correct Position, site marked, Risks and benefits discussed,  Surgical consent,  Pre-op evaluation,  At surgeon's request and post-op pain management  Laterality: Left  Prep: chloraprep       Needles:   Needle Type: Echogenic Stimulator Needle     Needle Length: 9cm  Needle Gauge: 21    Additional Needles:  Procedures: ultrasound guided Axillary brachial plexus block Narrative:  Start time: 07/01/2011 8:02 AM End time: 07/01/2011 8:14 AM Injection made incrementally with aspirations every 5 mL.  Performed by: Personally  Anesthesiologist: C Frederick  Additional Notes: Ultrasound guidance used to: id relevant anatomy, confirm needle position, local anesthetic spread, avoidance of vascular puncture. Picture saved. No complications. Block performed personally by Janetta Hora. Gelene Mink, MD    Supraclavicular block Procedure Name: LMA Insertion Date/Time: 07/01/2011 8:42 AM Performed by: Daxten Kovalenko D Pre-anesthesia Checklist: Patient identified, Emergency Drugs available, Suction available and Patient being monitored Patient Re-evaluated:Patient Re-evaluated prior to inductionOxygen Delivery Method: Circle System Utilized Preoxygenation: Pre-oxygenation with 100% oxygen Intubation Type: IV induction Ventilation: Mask ventilation without difficulty LMA: LMA with gastric port inserted LMA Size: 4.0 Number of attempts: 1 Placement Confirmation: positive ETCO2 Tube secured with: Tape Dental Injury: Teeth and Oropharynx as per pre-operative assessment

## 2011-07-01 NOTE — Transfer of Care (Signed)
Immediate Anesthesia Transfer of Care Note  Patient: Linda Davis  Procedure(s) Performed: Procedure(s) (LRB): CARPECTOMY (Left)  Patient Location: PACU  Anesthesia Type: GA combined with regional for post-op pain  Level of Consciousness: awake, alert , oriented and patient cooperative  Airway & Oxygen Therapy: Patient Spontanous Breathing and Patient connected to face mask oxygen  Post-op Assessment: Report given to PACU RN and Post -op Vital signs reviewed and stable  Post vital signs: Reviewed and stable  Complications: No apparent anesthesia complications

## 2011-07-01 NOTE — Anesthesia Postprocedure Evaluation (Signed)
  Anesthesia Post-op Note  Patient: Linda Davis  Procedure(s) Performed: Procedure(s) (LRB): CARPECTOMY (Left)  Patient Location: PACU  Anesthesia Type: General and GA combined with regional for post-op pain  Level of Consciousness: awake  Airway and Oxygen Therapy: Patient Spontanous Breathing  Post-op Pain: none  Post-op Assessment: Post-op Vital signs reviewed, Patient's Cardiovascular Status Stable, Respiratory Function Stable, Patent Airway, No signs of Nausea or vomiting and Pain level controlled  Post-op Vital Signs: stable  Complications: No apparent anesthesia complications

## 2011-07-01 NOTE — Discharge Instructions (Addendum)
[facility] Instructions °Hand Surgery ° °Wound Care: °Keep your hand elevated above the level of your heart.  Do not allow it to dangle by your side.  Keep the dressing dry and do not remove it unless your doctor advises you to do so.  He will usually change it at the time of your post-op visit.  Moving your fingers is advised to stimulate circulation but will depend on the site of your surgery.  If you have a splint applied, your doctor will advise you regarding movement. ° °Activity: °Do not drive or operate machinery today.  Rest today and then you may return to your normal activity and work as indicated by your physician. ° °Diet:  °Drink liquids today or eat a light diet.  You may resume a regular diet tomorrow.   ° °General expectations: °Pain for two to three days. °Fingers may become slightly swollen. ° °Call your doctor if any of the following occur: °Severe pain not relieved by pain medication. °Elevated temperature. °Dressing soaked with blood. °Inability to move fingers. °White or bluish color to fingers. ° ° °Regional Anesthesia Blocks ° °1. Numbness or the inability to move the "blocked" extremity may last from 3-48 hours after placement. The length of time depends on the medication injected and your individual response to the medication. If the numbness is not going away after 48 hours, call your surgeon. ° °2. The extremity that is blocked will need to be protected until the numbness is gone and the  Strength has returned. Because you cannot feel it, you will need to take extra care to avoid injury. Because it may be weak, you may have difficulty moving it or using it. You may not know what position it is in without looking at it while the block is in effect. ° °3. For blocks in the legs and feet, returning to weight bearing and walking needs to be done carefully. You will need to wait until the numbness is entirely gone and the strength has returned. You should be able to move your leg and foot  normally before you try and bear weight or walk. You will need someone to be with you when you first try to ensure you do not fall and possibly risk injury. ° °4. Bruising and tenderness at the needle site are common side effects and will resolve in a few days. ° °5. Persistent numbness or new problems with movement should be communicated to the surgeon or the [facility] ([phone])/ [facility] ([phone]). ° ° °Post Anesthesia Home Care Instructions ° °Activity: °Get plenty of rest for the remainder of the day. A responsible adult should stay with you for 24 hours following the procedure.  °For the next 24 hours, DO NOT: °-Drive a car °-Operate machinery °-Drink alcoholic beverages °-Take any medication unless instructed by your physician °-Make any legal decisions or sign important papers. ° °Meals: °Start with liquid foods such as gelatin or soup. Progress to regular foods as tolerated. Avoid greasy, spicy, heavy foods. If nausea and/or vomiting occur, drink only clear liquids until the nausea and/or vomiting subsides. Call your physician if vomiting continues. ° °Special Instructions/Symptoms: °Your throat may feel dry or sore from the anesthesia or the breathing tube placed in your throat during surgery. If this causes discomfort, gargle with warm salt water. The discomfort should disappear within 24 hours. ° °

## 2011-07-02 ENCOUNTER — Encounter (HOSPITAL_BASED_OUTPATIENT_CLINIC_OR_DEPARTMENT_OTHER): Payer: Self-pay | Admitting: Orthopedic Surgery

## 2015-01-21 LAB — LIPID PANEL: LDL Cholesterol: 141 mg/dL

## 2016-02-09 LAB — HEMOGLOBIN A1C: HEMOGLOBIN A1C: 7.9

## 2016-05-11 ENCOUNTER — Encounter: Payer: Self-pay | Admitting: "Endocrinology

## 2016-05-11 ENCOUNTER — Ambulatory Visit (INDEPENDENT_AMBULATORY_CARE_PROVIDER_SITE_OTHER): Payer: Medicare Other | Admitting: "Endocrinology

## 2016-05-11 VITALS — BP 143/74 | HR 90 | Wt 167.0 lb

## 2016-05-11 DIAGNOSIS — I10 Essential (primary) hypertension: Secondary | ICD-10-CM | POA: Insufficient documentation

## 2016-05-11 DIAGNOSIS — I824Z1 Acute embolism and thrombosis of unspecified deep veins of right distal lower extremity: Secondary | ICD-10-CM

## 2016-05-11 DIAGNOSIS — E7800 Pure hypercholesterolemia, unspecified: Secondary | ICD-10-CM | POA: Diagnosis not present

## 2016-05-11 DIAGNOSIS — I82409 Acute embolism and thrombosis of unspecified deep veins of unspecified lower extremity: Secondary | ICD-10-CM | POA: Insufficient documentation

## 2016-05-11 DIAGNOSIS — IMO0002 Reserved for concepts with insufficient information to code with codable children: Secondary | ICD-10-CM | POA: Insufficient documentation

## 2016-05-11 DIAGNOSIS — E1165 Type 2 diabetes mellitus with hyperglycemia: Secondary | ICD-10-CM

## 2016-05-11 DIAGNOSIS — E118 Type 2 diabetes mellitus with unspecified complications: Secondary | ICD-10-CM | POA: Diagnosis not present

## 2016-05-11 MED ORDER — ATORVASTATIN CALCIUM 20 MG PO TABS: 20.0000 mg | ORAL_TABLET | Freq: Every day | ORAL | 3 refills | Status: DC

## 2016-05-11 NOTE — Progress Notes (Signed)
Subjective:    Patient ID: Linda Davis, female    DOB: 1951/03/16. Patient is being seen in consultation for management of diabetes requested by  Arlina Robes, MD  Past Medical History:  Diagnosis Date  . Arthritis   . Diabetes mellitus   . Hypertension   . Seasonal allergies    Past Surgical History:  Procedure Laterality Date  . ABDOMINAL HYSTERECTOMY    . BACK SURGERY     x4  . BRONCHOSCOPY    . CARPAL TUNNEL RELEASE     both rt/lt  . CARPECTOMY  07/01/2011   Procedure: CARPECTOMY;  Surgeon: Nicki Reaper, MD;  Location: [facility];  Service: Orthopedics;  Laterality: Left;  PROXIMAL ROW CARPECTOMY LEFT WIRST,styloidectomy  . COLONOSCOPY    . HAND TENDON SURGERY     rt   . LUMBAR FUSION  2006,2007,1985,1990  . THORACOTOMY  1995   rt-scar tissue from pneumonia   Social History   Social History  . Marital status: Married    Spouse name: N/A  . Number of children: N/A  . Years of education: N/A   Social History Main Topics  . Smoking status: Current Every Day Smoker    Packs/day: 0.25  . Smokeless tobacco: Never Used  . Alcohol use No  . Drug use: No  . Sexual activity: Not Asked   Other Topics Concern  . None   Social History Narrative  . None   Outpatient Encounter Prescriptions as of 05/11/2016  Medication Sig  . clopidogrel (PLAVIX) 75 MG tablet Take 75 mg by mouth daily.  . Dulaglutide (TRULICITY) 1.5 MG/0.5ML SOPN Inject into the skin once a week.  Marland Kitchen gemfibrozil (LOPID) 600 MG tablet Take 600 mg by mouth 2 (two) times daily before a meal.  . Insulin Glargine (LANTUS SOLOSTAR) 100 UNIT/ML Solostar Pen Inject into the skin at bedtime.  Marland Kitchen linaclotide (LINZESS) 290 MCG CAPS capsule Take 290 mcg by mouth daily before breakfast.  . losartan-hydrochlorothiazide (HYZAAR) 100-12.5 MG per tablet Take 1 tablet by mouth daily.  . metFORMIN (GLUCOPHAGE) 500 MG tablet Take 500 mg by mouth 2 (two) times daily with a meal.  . Multiple  Vitamin (MULTIVITAMIN) capsule Take 1 capsule by mouth daily.  . [DISCONTINUED] glimepiride (AMARYL) 4 MG tablet Take 4 mg by mouth daily with breakfast.  . [DISCONTINUED] aspirin 81 MG tablet Take 81 mg by mouth daily.  . [DISCONTINUED] fluticasone (FLONASE) 50 MCG/ACT nasal spray Place 2 sprays into the nose daily.  . [DISCONTINUED] glimepiride (AMARYL) 2 MG tablet Take 2 mg by mouth daily before breakfast.   No facility-administered encounter medications on file as of 05/11/2016.    ALLERGIES: Allergies  Allergen Reactions  . Morphine And Related Swelling   VACCINATION STATUS:  There is no immunization history on file for this patient.  Diabetes  She presents for her initial diabetic visit. She has type 2 diabetes mellitus. Onset time: She was diagnosed at approximate age of 39 years. Her disease course has been worsening. There are no hypoglycemic associated symptoms. Pertinent negatives for hypoglycemia include no confusion, headaches, pallor or seizures. Associated symptoms include blurred vision, fatigue, polydipsia and polyuria. Pertinent negatives for diabetes include no chest pain and no polyphagia. There are no hypoglycemic complications. Symptoms are worsening. There are no diabetic complications. Risk factors for coronary artery disease include diabetes mellitus, dyslipidemia, hypertension, sedentary lifestyle and tobacco exposure. Current diabetic treatment includes oral agent (dual therapy) (She is currently on metformin  1000 g by mouth twice a day, glimepiride 4 mg by mouth daily, trulicity 1.5 mg weekly.). Her weight is stable. She is following a generally unhealthy diet. When asked about meal planning, she reported none. She has not had a previous visit with a dietitian. She rarely participates in exercise. (She did not bring the meter nor logs to review today. She admits she does not use her meter regularly. ) An ACE inhibitor/angiotensin II receptor blocker is being taken. She  sees a podiatrist.Eye exam is current.  Hyperlipidemia  This is a chronic problem. The current episode started more than 1 year ago. The problem is uncontrolled. Recent lipid tests were reviewed and are variable. Exacerbating diseases include diabetes. Pertinent negatives include no chest pain, myalgias or shortness of breath. Current antihyperlipidemic treatment includes fibric acid derivatives. Risk factors for coronary artery disease include diabetes mellitus, dyslipidemia, hypertension and a sedentary lifestyle.  Hypertension  This is a chronic problem. The current episode started more than 1 year ago. The problem is uncontrolled. Associated symptoms include blurred vision. Pertinent negatives include no chest pain, headaches, palpitations or shortness of breath. Risk factors for coronary artery disease include dyslipidemia, diabetes mellitus, smoking/tobacco exposure and sedentary lifestyle. Past treatments include angiotensin blockers.       Review of Systems  Constitutional: Positive for fatigue. Negative for chills, fever and unexpected weight change.  HENT: Negative for trouble swallowing and voice change.   Eyes: Positive for blurred vision. Negative for visual disturbance.  Respiratory: Negative for cough, shortness of breath and wheezing.   Cardiovascular: Negative for chest pain, palpitations and leg swelling.  Gastrointestinal: Negative for diarrhea, nausea and vomiting.  Endocrine: Positive for polydipsia and polyuria. Negative for cold intolerance, heat intolerance and polyphagia.  Musculoskeletal: Negative for arthralgias and myalgias.  Skin: Negative for color change, pallor, rash and wound.  Neurological: Negative for seizures and headaches.  Psychiatric/Behavioral: Negative for confusion and suicidal ideas.    Objective:    BP (!) 143/74   Pulse 90   Wt 167 lb (75.8 kg)   BMI 30.54 kg/m   Wt Readings from Last 3 Encounters:  05/11/16 167 lb (75.8 kg)  06/25/11 164  lb (74.4 kg)    Physical Exam  Constitutional: She is oriented to person, place, and time. She appears well-developed.  HENT:  Head: Normocephalic and atraumatic.  Eyes: EOM are normal.  Neck: Normal range of motion. Neck supple. No tracheal deviation present. No thyromegaly present.  Cardiovascular: Normal rate and regular rhythm.   Pulmonary/Chest: Effort normal and breath sounds normal.  Abdominal: Soft. Bowel sounds are normal. There is no tenderness. There is no guarding.  Musculoskeletal: Normal range of motion. She exhibits no edema.  Neurological: She is alert and oriented to person, place, and time. She has normal reflexes. No cranial nerve deficit. Coordination normal.  Skin: Skin is warm and dry. No rash noted. No erythema. No pallor.  Psychiatric: She has a normal mood and affect. Judgment normal.      CMP     Component Value Date/Time   NA 138 07/01/2011 0757   K 4.1 07/01/2011 0757   CL 103 07/01/2011 0757   GLUCOSE 227 (H) 07/01/2011 0757   BUN 11 07/01/2011 0757   CREATININE 0.80 07/01/2011 0757      Assessment & Plan:   1. Uncontrolled type 2 diabetes mellitus with complication, without long-term current use of insulin (HCC)   - Patient has currently uncontrolled symptomatic type 2 DM since  66  years of age,  with most recent A1c of 7.9 %.  - She does not have recent labs to review, I will send her for labs today.   She does not report gross complications from diabetes, however she remains at a high risk for more acute and chronic complications of diabetes which include CAD, CVA, CKD, retinopathy, and neuropathy. These are all discussed in detail with the patient.  - I have counseled the patient on diet management and weight loss, by adopting a carbohydrate restricted/protein rich diet.  - Suggestion is made for patient to avoid simple carbohydrates   from their diet including Cakes , Desserts, Ice Cream,  Soda (  diet and regular) , Sweet Tea , Candies,   Chips, Cookies, Artificial Sweeteners,   and "Sugar-free" Products . This will help patient to have stable blood glucose profile and potentially avoid unintended weight gain.  - I encouraged the patient to switch to  unprocessed or minimally processed complex starch and increased protein intake (animal or plant source), fruits, and vegetables.  - Patient is advised to stick to a routine mealtimes to eat 3 meals  a day and avoid unnecessary snacks ( to snack only to correct hypoglycemia).  - The patient will be scheduled with Norm SaltPenny Crumpton, RDN, CDE for individualized DM education.  - I have approached patient with the following individualized plan to manage diabetes and patient agrees:   - I  will proceed to initiate strict monitoring of glucose 4 times a day-before meals and at bedtime and return in one week with her meter and logs. - Depending on her glycemic profile, she may need at least a basal insulin to treat diabetes to target.  -Patient is encouraged to call clinic for blood glucose levels less than 70 or above 300 mg /dl. - I will continue  metformin 1000 mg by mouth twice a day, andTrulicity 1.5 mg subcutaneously weekly therapeutically suitable for patient. - I will discontinue  glimepiride, risk outweighs benefit for this patient.  - Patient specific target  A1c;  LDL, HDL, Triglycerides, and  Waist Circumference were discussed in detail.  2) BP/HTN:  Uncontrolled. Continue current medications including ACEI/ARB. 3) Lipids/HPL:  Her LDL was 141 on 01/21/2015.  She is not on the statin.  I advised her to continue gemfibrozil 600 mg by mouth twice a day. She will  benefit from statin therapy.  4)  Weight/Diet: CDE Consult will be initiated , exercise, and detailed carbohydrates information provided.  5) Chronic Care/Health Maintenance:  -Patient is on ARB and initiated on  Statin medications and encouraged to continue to follow up with Ophthalmology, Podiatrist at least yearly or  according to recommendations, and advised to   stay away from smoking. I have recommended yearly flu vaccine and pneumonia vaccination at least every 5 years; moderate intensity exercise for up to 150 minutes weekly; and  sleep for at least 7 hours a day.  - 60 minutes of time was spent on the care of this patient , 50% of which was applied for counseling on diabetes complications and their preventions.  - Patient to bring meter and  blood glucose logs during her next visit.   - I advised patient to maintain close follow up with Arlina RobesWINFIELD,ALBERT CARL, MD for primary care needs.  Follow up plan: - Return in about 1 week (around 05/18/2016) for labs today.  Marquis LunchGebre Nida, MD Phone: [phone]  Fax: [phone]   05/11/2016, 3:13 PM

## 2016-05-12 LAB — COMPREHENSIVE METABOLIC PANEL
ALK PHOS: 70 U/L (ref 33–130)
ALT: 14 U/L (ref 6–29)
AST: 19 U/L (ref 10–35)
Albumin: 4.4 g/dL (ref 3.6–5.1)
BUN: 18 mg/dL (ref 7–25)
CO2: 27 mmol/L (ref 20–31)
CREATININE: 1.27 mg/dL — AB (ref 0.50–0.99)
Calcium: 9.8 mg/dL (ref 8.6–10.4)
Chloride: 101 mmol/L (ref 98–110)
GLUCOSE: 256 mg/dL — AB (ref 65–99)
Potassium: 4.3 mmol/L (ref 3.5–5.3)
SODIUM: 137 mmol/L (ref 135–146)
Total Bilirubin: 0.3 mg/dL (ref 0.2–1.2)
Total Protein: 7.2 g/dL (ref 6.1–8.1)

## 2016-05-12 LAB — HEMOGLOBIN A1C
Hgb A1c MFr Bld: 9.4 % — ABNORMAL HIGH (ref <5.7)
Mean Plasma Glucose: 223 mg/dL

## 2016-05-18 ENCOUNTER — Telehealth: Payer: Self-pay

## 2016-05-18 NOTE — Telephone Encounter (Signed)
Advise to increase Lantus to 30 units daily at bedtime, she can take metformin only once a day after supper. Continue monitoring before meals and at bedtime and return with meter and logs on appointment.

## 2016-05-18 NOTE — Telephone Encounter (Signed)
Pt states she has had high BG readings.   Date Before breakfast Before lunch Before supper Bedtime  2/24 [phone] 257  2/25 230 299 198 247  2/26 218 344            Pt taking:  Lantus 20 units qhs, Trulicity 1.5 mg weekly, MTF 1000 mg bid...Marland Kitchen.Marland Kitchen.Marland Kitchen.    She states the MTf is making her feel weak also

## 2016-05-18 NOTE — Telephone Encounter (Signed)
Pt.notified

## 2016-05-22 ENCOUNTER — Ambulatory Visit (INDEPENDENT_AMBULATORY_CARE_PROVIDER_SITE_OTHER): Payer: Medicare Other | Admitting: "Endocrinology

## 2016-05-22 ENCOUNTER — Encounter: Payer: Self-pay | Admitting: "Endocrinology

## 2016-05-22 VITALS — BP 137/73 | HR 92 | Wt 164.0 lb

## 2016-05-22 DIAGNOSIS — E118 Type 2 diabetes mellitus with unspecified complications: Secondary | ICD-10-CM | POA: Diagnosis not present

## 2016-05-22 DIAGNOSIS — E1165 Type 2 diabetes mellitus with hyperglycemia: Secondary | ICD-10-CM

## 2016-05-22 DIAGNOSIS — I10 Essential (primary) hypertension: Secondary | ICD-10-CM | POA: Diagnosis not present

## 2016-05-22 DIAGNOSIS — E7800 Pure hypercholesterolemia, unspecified: Secondary | ICD-10-CM

## 2016-05-22 DIAGNOSIS — IMO0002 Reserved for concepts with insufficient information to code with codable children: Secondary | ICD-10-CM

## 2016-05-22 MED ORDER — DULAGLUTIDE 1.5 MG/0.5ML ~~LOC~~ SOAJ: 1.5000 mg | SUBCUTANEOUS | 3 refills | Status: DC

## 2016-05-22 NOTE — Patient Instructions (Signed)

## 2016-05-22 NOTE — Progress Notes (Signed)
Subjective:    Patient ID: Linda Davis, female    DOB: 06/28/1950. Patient is being seen in consultation for management of diabetes requested by  Arlina RobesWINFIELD,ALBERT CARL, MD  Past Medical History:  Diagnosis Date  . Arthritis   . Diabetes mellitus   . Hypertension   . Seasonal allergies    Past Surgical History:  Procedure Laterality Date  . ABDOMINAL HYSTERECTOMY    . BACK SURGERY     x4  . BRONCHOSCOPY    . CARPAL TUNNEL RELEASE     both rt/lt  . CARPECTOMY  07/01/2011   Procedure: CARPECTOMY;  Surgeon: Nicki ReaperGary R Kuzma, MD;  Location: [facility];  Service: Orthopedics;  Laterality: Left;  PROXIMAL ROW CARPECTOMY LEFT WIRST,styloidectomy  . COLONOSCOPY    . HAND TENDON SURGERY     rt   . LUMBAR FUSION  2006,2007,1985,1990  . THORACOTOMY  1995   rt-scar tissue from pneumonia   Social History   Social History  . Marital status: Married    Spouse name: N/A  . Number of children: N/A  . Years of education: N/A   Social History Main Topics  . Smoking status: Current Every Day Smoker    Packs/day: 0.25  . Smokeless tobacco: Never Used  . Alcohol use No  . Drug use: No  . Sexual activity: Not Asked   Other Topics Concern  . None   Social History Narrative  . None   Outpatient Encounter Prescriptions as of 05/22/2016  Medication Sig  . aspirin EC 81 MG tablet Take 81 mg by mouth daily.  . clopidogrel (PLAVIX) 75 MG tablet Take 75 mg by mouth daily.  . Dulaglutide (TRULICITY) 0.75 MG/0.5ML SOPN Inject into the skin once a week.  Marland Kitchen. gemfibrozil (LOPID) 600 MG tablet Take 600 mg by mouth 2 (two) times daily before a meal.  . Insulin Glargine (LANTUS SOLOSTAR) 100 UNIT/ML Solostar Pen Inject 40 Units into the skin at bedtime.  Marland Kitchen. linaclotide (LINZESS) 290 MCG CAPS capsule Take 290 mcg by mouth daily before breakfast.  . losartan-hydrochlorothiazide (HYZAAR) 100-12.5 MG per tablet Take 1 tablet by mouth daily.  . metFORMIN (GLUCOPHAGE) 500 MG tablet Take  500 mg by mouth 2 (two) times daily with a meal.  . Multiple Vitamin (MULTIVITAMIN) capsule Take 1 capsule by mouth daily.  Marland Kitchen. atorvastatin (LIPITOR) 20 MG tablet Take 1 tablet (20 mg total) by mouth daily.   No facility-administered encounter medications on file as of 05/22/2016.    ALLERGIES: Allergies  Allergen Reactions  . Morphine And Related Swelling   VACCINATION STATUS:  There is no immunization history on file for this patient.  Diabetes  She presents for her follow-up diabetic visit. She has type 2 diabetes mellitus. Onset time: She was diagnosed at approximate age of 39 years. Her disease course has been worsening. There are no hypoglycemic associated symptoms. Pertinent negatives for hypoglycemia include no confusion, headaches, pallor or seizures. Associated symptoms include blurred vision, fatigue, polydipsia and polyuria. Pertinent negatives for diabetes include no chest pain and no polyphagia. There are no hypoglycemic complications. Symptoms are worsening. There are no diabetic complications. Risk factors for coronary artery disease include diabetes mellitus, dyslipidemia, hypertension, sedentary lifestyle and tobacco exposure. Current diabetic treatment includes oral agent (dual therapy) (She is currently on metformin 1000 g by mouth twice a day, glimepiride 4 mg by mouth daily, trulicity 1.5 mg weekly.). Her weight is stable. She is following a generally unhealthy diet. When asked  about meal planning, she reported none. She has not had a previous visit with a dietitian. She rarely participates in exercise. Her breakfast blood glucose range is generally 180-200 mg/dl. Her lunch blood glucose range is generally 180-200 mg/dl. Her dinner blood glucose range is generally 180-200 mg/dl. Her overall blood glucose range is 180-200 mg/dl. ( ) An ACE inhibitor/angiotensin II receptor blocker is being taken. She sees a podiatrist.Eye exam is current.  Hyperlipidemia  This is a chronic  problem. The current episode started more than 1 year ago. The problem is uncontrolled. Recent lipid tests were reviewed and are variable. Exacerbating diseases include diabetes. Pertinent negatives include no chest pain, myalgias or shortness of breath. Current antihyperlipidemic treatment includes fibric acid derivatives. Risk factors for coronary artery disease include diabetes mellitus, dyslipidemia, hypertension and a sedentary lifestyle.  Hypertension  This is a chronic problem. The current episode started more than 1 year ago. The problem is uncontrolled. Associated symptoms include blurred vision. Pertinent negatives include no chest pain, headaches, palpitations or shortness of breath. Risk factors for coronary artery disease include dyslipidemia, diabetes mellitus, smoking/tobacco exposure and sedentary lifestyle. Past treatments include angiotensin blockers.       Review of Systems  Constitutional: Positive for fatigue. Negative for chills, fever and unexpected weight change.  HENT: Negative for trouble swallowing and voice change.   Eyes: Positive for blurred vision. Negative for visual disturbance.  Respiratory: Negative for cough, shortness of breath and wheezing.   Cardiovascular: Negative for chest pain, palpitations and leg swelling.  Gastrointestinal: Negative for diarrhea, nausea and vomiting.  Endocrine: Positive for polydipsia and polyuria. Negative for cold intolerance, heat intolerance and polyphagia.  Musculoskeletal: Negative for arthralgias and myalgias.  Skin: Negative for color change, pallor, rash and wound.  Neurological: Negative for seizures and headaches.  Psychiatric/Behavioral: Negative for confusion and suicidal ideas.    Objective:    BP 137/73   Pulse 92   Wt 164 lb (74.4 kg)   BMI 30.00 kg/m   Wt Readings from Last 3 Encounters:  05/22/16 164 lb (74.4 kg)  05/11/16 167 lb (75.8 kg)  06/25/11 164 lb (74.4 kg)    Physical Exam  Constitutional:  She is oriented to person, place, and time. She appears well-developed.  HENT:  Head: Normocephalic and atraumatic.  Eyes: EOM are normal.  Neck: Normal range of motion. Neck supple. No tracheal deviation present. No thyromegaly present.  Cardiovascular: Normal rate and regular rhythm.   Pulmonary/Chest: Effort normal and breath sounds normal.  Abdominal: Soft. Bowel sounds are normal. There is no tenderness. There is no guarding.  Musculoskeletal: Normal range of motion. She exhibits no edema.  Neurological: She is alert and oriented to person, place, and time. She has normal reflexes. No cranial nerve deficit. Coordination normal.  Skin: Skin is warm and dry. No rash noted. No erythema. No pallor.  Psychiatric: She has a normal mood and affect. Judgment normal.   Recent Results (from the past 2160 hour(s))  Comprehensive metabolic panel     Status: Abnormal   Collection Time: 05/11/16  2:20 PM  Result Value Ref Range   Sodium 137 135 - 146 mmol/L   Potassium 4.3 3.5 - 5.3 mmol/L   Chloride 101 98 - 110 mmol/L   CO2 27 20 - 31 mmol/L   Glucose, Bld 256 (H) 65 - 99 mg/dL   BUN 18 7 - 25 mg/dL   Creat 8.11 (H) 9.14 - 0.99 mg/dL    Comment:   For  patients > or = 67 years of age: The upper reference limit for Creatinine is approximately 13% higher for people identified as African-American.      Total Bilirubin 0.3 0.2 - 1.2 mg/dL   Alkaline Phosphatase 70 33 - 130 U/L   AST 19 10 - 35 U/L   ALT 14 6 - 29 U/L   Total Protein 7.2 6.1 - 8.1 g/dL   Albumin 4.4 3.6 - 5.1 g/dL   Calcium 9.8 8.6 - 16.1 mg/dL  Hemoglobin W9U     Status: Abnormal   Collection Time: 05/11/16  2:20 PM  Result Value Ref Range   Hgb A1c MFr Bld 9.4 (H) <5.7 %    Comment:   For someone without known diabetes, a hemoglobin A1c value of 6.5% or greater indicates that they may have diabetes and this should be confirmed with a follow-up test.   For someone with known diabetes, a value <7% indicates that  their diabetes is well controlled and a value greater than or equal to 7% indicates suboptimal control. A1c targets should be individualized based on duration of diabetes, age, comorbid conditions, and other considerations.   Currently, no consensus exists for use of hemoglobin A1c for diagnosis of diabetes for children.      Mean Plasma Glucose 223 mg/dL      Assessment & Plan:   1. Uncontrolled type 2 diabetes mellitus with complication, without long-term current use of insulin (HCC)   - Patient has currently uncontrolled symptomatic type 2 DM since  66 years of age. - Her most recent A1c is 9.4% increasing from 7.9 %.    She does not report gross complications from diabetes, however she remains at a high risk for more acute and chronic complications of diabetes which include CAD, CVA, CKD, retinopathy, and neuropathy. These are all discussed in detail with the patient.  - I have counseled the patient on diet management and weight loss, by adopting a carbohydrate restricted/protein rich diet.  - Suggestion is made for patient to avoid simple carbohydrates   from their diet including Cakes , Desserts, Ice Cream,  Soda (  diet and regular) , Sweet Tea , Candies,  Chips, Cookies, Artificial Sweeteners,   and "Sugar-free" Products . This will help patient to have stable blood glucose profile and potentially avoid unintended weight gain.  - I encouraged the patient to switch to  unprocessed or minimally processed complex starch and increased protein intake (animal or plant source), fruits, and vegetables.  - Patient is advised to stick to a routine mealtimes to eat 3 meals  a day and avoid unnecessary snacks ( to snack only to correct hypoglycemia).  - The patient will be scheduled with Norm Salt, RDN, CDE for individualized DM education.  - I have approached patient with the following individualized plan to manage diabetes and patient agrees:   - I  will proceed to  readjust  her basal insulin Lantus to 40 units daily at bedtime,  Continue strict monitoring of glucose 4 times a day-before meals and at bedtime and return in one week with her meter and logs. - She will return in 2 weeks with her meter and logs to adjust her therapy.   -Patient is encouraged to call clinic for blood glucose levels less than 70 or above 300 mg /dl. - I will continue  metformin 500 mg by mouth twice a day, and will increase Trulicity to 1.5 mg subcutaneously weekly therapeutically suitable for patient.  - Patient  specific target  A1c;  LDL, HDL, Triglycerides, and  Waist Circumference were discussed in detail.  2) BP/HTN:  controlled. Continue current medications including ACEI/ARB. 3) Lipids/HPL:  Her LDL was 141 on 01/21/2015.  She is not on the statin, reports statin intolerance from prior attempt.  I advised her to continue gemfibrozil 600 mg by mouth twice a day.  4)  Weight/Diet: CDE Consult  has been  initiated , exercise, and detailed carbohydrates information provided.  5) Chronic Care/Health Maintenance:  -Patient is on ARB and initiated on  Statin medications and encouraged to continue to follow up with Ophthalmology, Podiatrist at least yearly or according to recommendations, and advised to   stay away from smoking. I have recommended yearly flu vaccine and pneumonia vaccination at least every 5 years; moderate intensity exercise for up to 150 minutes weekly; and  sleep for at least 7 hours a day.  - 30 minutes of time was spent on the care of this patient , 50% of which was applied for counseling on diabetes complications and their preventions.  - Patient to bring meter and  blood glucose logs during her next visit.   - I advised patient to maintain close follow up with Arlina Robes, MD for primary care needs.  Follow up plan: - Return in about 2 weeks (around 06/05/2016) for follow up with meter and logs- no labs.  Marquis Lunch, MD Phone: [phone]  Fax:  [phone]   05/22/2016, 10:46 AM

## 2016-06-09 ENCOUNTER — Other Ambulatory Visit: Payer: Self-pay | Admitting: "Endocrinology

## 2016-06-09 ENCOUNTER — Telehealth: Payer: Self-pay

## 2016-06-09 ENCOUNTER — Other Ambulatory Visit: Payer: Self-pay

## 2016-06-09 ENCOUNTER — Ambulatory Visit (INDEPENDENT_AMBULATORY_CARE_PROVIDER_SITE_OTHER): Payer: Medicare Other | Admitting: "Endocrinology

## 2016-06-09 ENCOUNTER — Encounter: Payer: Self-pay | Admitting: "Endocrinology

## 2016-06-09 ENCOUNTER — Encounter: Payer: Medicare Other | Attending: Family Medicine | Admitting: Nutrition

## 2016-06-09 VITALS — Ht 62.0 in | Wt 164.0 lb

## 2016-06-09 VITALS — BP 128/74 | HR 76 | Ht 65.0 in | Wt 164.0 lb

## 2016-06-09 DIAGNOSIS — E7800 Pure hypercholesterolemia, unspecified: Secondary | ICD-10-CM

## 2016-06-09 DIAGNOSIS — I10 Essential (primary) hypertension: Secondary | ICD-10-CM | POA: Diagnosis not present

## 2016-06-09 DIAGNOSIS — Z794 Long term (current) use of insulin: Secondary | ICD-10-CM

## 2016-06-09 DIAGNOSIS — E1165 Type 2 diabetes mellitus with hyperglycemia: Secondary | ICD-10-CM | POA: Insufficient documentation

## 2016-06-09 DIAGNOSIS — Z713 Dietary counseling and surveillance: Secondary | ICD-10-CM | POA: Insufficient documentation

## 2016-06-09 DIAGNOSIS — IMO0002 Reserved for concepts with insufficient information to code with codable children: Secondary | ICD-10-CM

## 2016-06-09 DIAGNOSIS — E118 Type 2 diabetes mellitus with unspecified complications: Secondary | ICD-10-CM | POA: Insufficient documentation

## 2016-06-09 MED ORDER — INSULIN GLARGINE 100 UNIT/ML SOLOSTAR PEN: 30.0000 [IU] | PEN_INJECTOR | Freq: Every day | SUBCUTANEOUS | 2 refills | Status: DC

## 2016-06-09 MED ORDER — INSULIN DEGLUDEC 100 UNIT/ML ~~LOC~~ SOPN: 30.0000 [IU] | PEN_INJECTOR | Freq: Every day | SUBCUTANEOUS | 2 refills | Status: DC

## 2016-06-09 NOTE — Patient Instructions (Signed)

## 2016-06-09 NOTE — Progress Notes (Signed)
Diabetes Self-Management Education  Visit Type: First/Initial  Appt. Start Time: 1030 Appt. End Time: 1130  06/09/2016  [name], identified by name and date of birth, is a 66 y.o. female with a diagnosis of Diabetes: Type 2. She is caring for her husband who has dementia. Eats mostly at home. Sometimes skips meals. Has been avoiding carbs. Lantus 30 units, Trulicity and Metformin 500 mg BID.  ASSESSMENT  Height 5\' 2"  (1.575 m), weight 164 lb (74.4 kg). Body mass index is 30 kg/m.  Lab Results  Component Value Date   HGBA1C 9.4 (H) 05/11/2016   CMP Latest Ref Rng & Units 05/11/2016 07/01/2011  Glucose 65 - 99 mg/dL 914(N) 829(F)  BUN 7 - 25 mg/dL 18 11  Creatinine 6.21 - 0.99 mg/dL 3.08(M) 5.78  Sodium 469 - 146 mmol/L 137 138  Potassium 3.5 - 5.3 mmol/L 4.3 4.1  Chloride 98 - 110 mmol/L 101 103  CO2 20 - 31 mmol/L 27 -  Calcium 8.6 - 10.4 mg/dL 9.8 -  Total Protein 6.1 - 8.1 g/dL 7.2 -  Total Bilirubin 0.2 - 1.2 mg/dL 0.3 -  Alkaline Phos 33 - 130 U/L 70 -  AST 10 - 35 U/L 19 -  ALT 6 - 29 U/L 14 -   Lipid Panel     Component Value Date/Time   LDLCALC 141 01/21/2015           Diabetes Self-Management Education - 06/09/16 1030      Visit Information   Visit Type First/Initial     Initial Visit   Diabetes Type Type 2   Are you currently following a meal plan? No   Are you taking your medications as prescribed? Yes   Date Diagnosed 2000     Health Coping   How would you rate your overall health? Good     Psychosocial Assessment   Patient Belief/Attitude about Diabetes Motivated to manage diabetes   Self-care barriers None   Self-management support Family;Doctor's office   Other persons present Patient   Patient Concerns Nutrition/Meal planning;Medication;Monitoring;Healthy Lifestyle;Problem Solving   Special Needs None   Preferred Learning Style No preference indicated   Learning Readiness Ready   How often do you need to have someone help you  when you read instructions, pamphlets, or other written materials from your doctor or pharmacy? 1 - Never   What is the last grade level you completed in school? 9     Pre-Education Assessment   Patient understands the diabetes disease and treatment process. Needs Instruction   Patient understands incorporating nutritional management into lifestyle. Needs Instruction   Patient undertands incorporating physical activity into lifestyle. Needs Instruction   Patient understands using medications safely. Needs Instruction   Patient understands monitoring blood glucose, interpreting and using results Needs Instruction   Patient understands prevention, detection, and treatment of acute complications. Needs Instruction   Patient understands prevention, detection, and treatment of chronic complications. Needs Instruction   Patient understands how to develop strategies to address psychosocial issues. Needs Instruction   Patient understands how to develop strategies to promote health/change behavior. Needs Instruction     Complications   Last HgB A1C per patient/outside source 9.4 %   How often do you check your blood sugar? 1-2 times/day   Fasting Blood glucose range (mg/dL) 629-528   Postprandial Blood glucose range (mg/dL) 413-244   Number of hypoglycemic episodes per month 1   Can you tell when your blood sugar is low? Yes  What do you do if your blood sugar is low? eat   Number of hyperglycemic episodes per week 10   Can you tell when your blood sugar is high? Yes   What do you do if your blood sugar is high? meds and water   Have you had a dilated eye exam in the past 12 months? Yes   Have you had a dental exam in the past 12 months? Yes   Are you checking your feet? Yes   How many days per week are you checking your feet? 7     Dietary Intake   Breakfast cereal or toast and fruit, water, lemonade   Lunch turnip greens, water   Dinner chicken, turnip greens   Snack (evening) fruit    Beverage(s) water, lemonade     Exercise   Exercise Type Light (walking / raking leaves)   How many days per week to you exercise? 30   How many minutes per day do you exercise? 4   Total minutes per week of exercise 120     Patient Education   Previous Diabetes Education No   Disease state  Definition of diabetes, type 1 and 2, and the diagnosis of diabetes;Factors that contribute to the development of diabetes;Explored patient's options for treatment of their diabetes   Nutrition management  Role of diet in the treatment of diabetes and the relationship between the three main macronutrients and blood glucose level;Food label reading, portion sizes and measuring food.;Carbohydrate counting;Reviewed blood glucose goals for pre and post meals and how to evaluate the patients' food intake on their blood glucose level.;Meal timing in regards to the patients' current diabetes medication.;Information on hints to eating out and maintain blood glucose control.;Meal options for control of blood glucose level and chronic complications.   Physical activity and exercise  Role of exercise on diabetes management, blood pressure control and cardiac health.;Identified with patient nutritional and/or medication changes necessary with exercise.;Helped patient identify appropriate exercises in relation to his/her diabetes, diabetes complications and other health issue.   Medications Taught/reviewed insulin injection, site rotation, insulin storage and needle disposal.;Reviewed patients medication for diabetes, action, purpose, timing of dose and side effects.;Reviewed medication adjustment guidelines for hyperglycemia and sick days.   Monitoring Taught/evaluated SMBG meter.;Purpose and frequency of SMBG.;Taught/discussed recording of test results and interpretation of SMBG.;Interpreting lab values - A1C, lipid, urine microalbumina.;Daily foot exams;Yearly dilated eye exam   Acute complications Taught treatment of  hypoglycemia - the 15 rule.;Discussed and identified patients' treatment of hyperglycemia.   Chronic complications Relationship between chronic complications and blood glucose control;Assessed and discussed foot care and prevention of foot problems;Lipid levels, blood glucose control and heart disease;Identified and discussed with patient  current chronic complications;Retinopathy and reason for yearly dilated eye exams;Reviewed with patient heart disease, higher risk of, and prevention;Nephropathy, what it is, prevention of, the use of ACE, ARB's and early detection of through urine microalbumia.   Psychosocial adjustment Worked with patient to identify barriers to care and solutions;Role of stress on diabetes;Helped patient identify a support system for diabetes management;Identified and addressed patients feelings and concerns about diabetes;Brainstormed with patient on coping mechanisms for social situations, getting support from significant others, dealing with feelings about diabetes   Personal strategies to promote health Lifestyle issues that need to be addressed for better diabetes care;Review risk of smoking and offered smoking cessation;Helped patient develop diabetes management plan for (enter comment)     Individualized Goals (developed by patient)   Nutrition Follow meal  plan discussed;General guidelines for healthy choices and portions discussed;Adjust meds/carbs with exercise as discussed   Physical Activity Exercise 3-5 times per week;30 minutes per day   Medications take my medication as prescribed   Monitoring  test my blood glucose as discussed   Reducing Risk examine blood glucose patterns;get labs drawn;do foot checks daily;treat hypoglycemia with 15 grams of carbs if blood glucose less than 70mg /dL;increase portions of healthy fats     Post-Education Assessment   Patient understands the diabetes disease and treatment process. Needs Review   Patient understands incorporating  nutritional management into lifestyle. Needs Review   Patient undertands incorporating physical activity into lifestyle. Needs Review     Outcomes   Expected Outcomes Demonstrated interest in learning. Expect positive outcomes   Future DMSE 3-4 months   Program Status Completed      Individualized Plan for Diabetes Self-Management Training:   Learning Objective:  Patient will have a greater understanding of diabetes self-management. Patient education plan is to attend individual and/or group sessions per assessed needs and concerns.   Plan:   Goals 1. Eat three meals per day at times discussed 2. Eat 30-45 grams of carbs plus protein at each meal and added veggies. 3. Keep drinking water or lemon water without sugar 4. Test blood sugars twice a day 5. Increase high fiber foods as tolerated 6. Keep exercising 30 minutes per day!  Avoid low blood sugars and BS > 180 mg/d Goals: In am  80-130 mg/dl and bedtime [id number] mg/dl  Expected Outcomes:  Demonstrated interest in learning. Expect positive outcomes  Education material provided: Living Well with DM, Plate Method, Meal Plan Card  If problems or questions, patient to contact team via:  Phone and Email  Future DSME appointment: 3-4 months

## 2016-06-09 NOTE — Telephone Encounter (Signed)
Pt called stating that we had to send in an alternative to Lantus because he insurance has denied it. She states that " this had to be done within the next five min" and then pt hung up on me.

## 2016-06-09 NOTE — Patient Instructions (Signed)
Goals 1. Eat three meals per day at times discussed 2. Eat 30-45 grams of carbs plus protein at each meal and added veggies. 3. Keep drinking water or lemon water without sugar 4. Test blood sugars twice a day 5. Increase high fiber foods as tolerated 6. Keep exercising 30 minutes per day!  Avoid low blood sugars and BS > 180 mg/d Goals: In am  80-130 mg/dl and bedtime [id number] mg/dl

## 2016-06-09 NOTE — Progress Notes (Signed)
Subjective:    Patient ID: Linda Davis, female    DOB: 05-Apr-1950. Patient is being seen in Follow-up for management of diabetes requested by  Arlina Robes, MD  Past Medical History:  Diagnosis Date  . Arthritis   . Diabetes mellitus   . Hypertension   . Seasonal allergies    Past Surgical History:  Procedure Laterality Date  . ABDOMINAL HYSTERECTOMY    . BACK SURGERY     x4  . BRONCHOSCOPY    . CARPAL TUNNEL RELEASE     both rt/lt  . CARPECTOMY  07/01/2011   Procedure: CARPECTOMY;  Surgeon: Nicki Reaper, MD;  Location: [facility];  Service: Orthopedics;  Laterality: Left;  PROXIMAL ROW CARPECTOMY LEFT WIRST,styloidectomy  . COLONOSCOPY    . HAND TENDON SURGERY     rt   . LUMBAR FUSION  2006,2007,1985,1990  . THORACOTOMY  1995   rt-scar tissue from pneumonia   Social History   Social History  . Marital status: Married    Spouse name: N/A  . Number of children: N/A  . Years of education: N/A   Social History Main Topics  . Smoking status: Current Every Day Smoker    Packs/day: 0.25  . Smokeless tobacco: Never Used  . Alcohol use No  . Drug use: No  . Sexual activity: Not Asked   Other Topics Concern  . None   Social History Narrative  . None   Outpatient Encounter Prescriptions as of 06/09/2016  Medication Sig  . aspirin EC 81 MG tablet Take 81 mg by mouth daily.  Marland Kitchen atorvastatin (LIPITOR) 20 MG tablet Take 1 tablet (20 mg total) by mouth daily.  . clopidogrel (PLAVIX) 75 MG tablet Take 75 mg by mouth daily.  . Dulaglutide (TRULICITY) 1.5 MG/0.5ML SOPN Inject 1.5 mg into the skin once a week.  . Insulin Glargine (LANTUS SOLOSTAR) 100 UNIT/ML Solostar Pen Inject 30 Units into the skin at bedtime.  Marland Kitchen linaclotide (LINZESS) 290 MCG CAPS capsule Take 290 mcg by mouth daily before breakfast.  . losartan-hydrochlorothiazide (HYZAAR) 100-12.5 MG per tablet Take 1 tablet by mouth daily.  . metFORMIN (GLUCOPHAGE) 500 MG tablet Take 500 mg  by mouth 2 (two) times daily with a meal.  . Multiple Vitamin (MULTIVITAMIN) capsule Take 1 capsule by mouth daily.  . [DISCONTINUED] gemfibrozil (LOPID) 600 MG tablet Take 600 mg by mouth 2 (two) times daily before a meal.  . [DISCONTINUED] Insulin Glargine (LANTUS SOLOSTAR) 100 UNIT/ML Solostar Pen Inject 40 Units into the skin at bedtime.   No facility-administered encounter medications on file as of 06/09/2016.    ALLERGIES: Allergies  Allergen Reactions  . Morphine And Related Swelling   VACCINATION STATUS:  There is no immunization history on file for this patient.  Diabetes  She presents for her follow-up diabetic visit. She has type 2 diabetes mellitus. Onset time: She was diagnosed at approximate age of 66 years. Her disease course has been worsening. There are no hypoglycemic associated symptoms. Pertinent negatives for hypoglycemia include no confusion, headaches, pallor or seizures. Associated symptoms include blurred vision, fatigue, polydipsia and polyuria. Pertinent negatives for diabetes include no chest pain and no polyphagia. There are no hypoglycemic complications. Symptoms are worsening. There are no diabetic complications. Risk factors for coronary artery disease include diabetes mellitus, dyslipidemia, hypertension, sedentary lifestyle and tobacco exposure. Current diabetic treatment includes oral agent (dual therapy) (She is currently on metformin 1000 g by mouth twice a day, glimepiride  4 mg by mouth daily, trulicity 1.5 mg weekly.). Her weight is stable. She is following a generally unhealthy diet. When asked about meal planning, she reported none. She has not had a previous visit with a dietitian. She rarely participates in exercise. Her breakfast blood glucose range is generally 180-200 mg/dl. Her lunch blood glucose range is generally 180-200 mg/dl. Her dinner blood glucose range is generally 180-200 mg/dl. Her overall blood glucose range is 180-200 mg/dl. ( ) An ACE  inhibitor/angiotensin II receptor blocker is being taken. She sees a podiatrist.Eye exam is current.  Hyperlipidemia  This is a chronic problem. The current episode started more than 1 year ago. The problem is uncontrolled. Recent lipid tests were reviewed and are variable. Exacerbating diseases include diabetes. Pertinent negatives include no chest pain, myalgias or shortness of breath. Current antihyperlipidemic treatment includes fibric acid derivatives. Risk factors for coronary artery disease include diabetes mellitus, dyslipidemia, hypertension and a sedentary lifestyle.  Hypertension  This is a chronic problem. The current episode started more than 1 year ago. The problem is uncontrolled. Associated symptoms include blurred vision. Pertinent negatives include no chest pain, headaches, palpitations or shortness of breath. Risk factors for coronary artery disease include dyslipidemia, diabetes mellitus, smoking/tobacco exposure and sedentary lifestyle. Past treatments include angiotensin blockers.    Review of Systems  Constitutional: Positive for fatigue. Negative for chills, fever and unexpected weight change.  HENT: Negative for trouble swallowing and voice change.   Eyes: Positive for blurred vision. Negative for visual disturbance.  Respiratory: Negative for cough, shortness of breath and wheezing.   Cardiovascular: Negative for chest pain, palpitations and leg swelling.  Gastrointestinal: Negative for diarrhea, nausea and vomiting.  Endocrine: Positive for polydipsia and polyuria. Negative for cold intolerance, heat intolerance and polyphagia.  Musculoskeletal: Negative for arthralgias and myalgias.  Skin: Negative for color change, pallor, rash and wound.  Neurological: Negative for seizures and headaches.  Psychiatric/Behavioral: Negative for confusion and suicidal ideas.    Objective:    BP 128/74   Pulse 76   Ht 5\' 5"  (1.651 m)   Wt 164 lb (74.4 kg)   BMI 27.29 kg/m   Wt  Readings from Last 3 Encounters:  06/09/16 164 lb (74.4 kg)  05/22/16 164 lb (74.4 kg)  05/11/16 167 lb (75.8 kg)    Physical Exam  Constitutional: She is oriented to person, place, and time. She appears well-developed.  HENT:  Head: Normocephalic and atraumatic.  Eyes: EOM are normal.  Neck: Normal range of motion. Neck supple. No tracheal deviation present. No thyromegaly present.  Cardiovascular: Normal rate and regular rhythm.   Pulmonary/Chest: Effort normal and breath sounds normal.  Abdominal: Soft. Bowel sounds are normal. There is no tenderness. There is no guarding.  Musculoskeletal: Normal range of motion. She exhibits no edema.  Neurological: She is alert and oriented to person, place, and time. She has normal reflexes. No cranial nerve deficit. Coordination normal.  Skin: Skin is warm and dry. No rash noted. No erythema. No pallor.  Psychiatric: She has a normal mood and affect. Judgment normal.   Recent Results (from the past 2160 hour(s))  Comprehensive metabolic panel     Status: Abnormal   Collection Time: 05/11/16  2:20 PM  Result Value Ref Range   Sodium 137 135 - 146 mmol/L   Potassium 4.3 3.5 - 5.3 mmol/L   Chloride 101 98 - 110 mmol/L   CO2 27 20 - 31 mmol/L   Glucose, Bld 256 (H) 65 -  99 mg/dL   BUN 18 7 - 25 mg/dL   Creat 1.61 (H) 0.96 - 0.99 mg/dL    Comment:   For patients > or = 67 years of age: The upper reference limit for Creatinine is approximately 13% higher for people identified as African-American.      Total Bilirubin 0.3 0.2 - 1.2 mg/dL   Alkaline Phosphatase 70 33 - 130 U/L   AST 19 10 - 35 U/L   ALT 14 6 - 29 U/L   Total Protein 7.2 6.1 - 8.1 g/dL   Albumin 4.4 3.6 - 5.1 g/dL   Calcium 9.8 8.6 - 04.5 mg/dL  Hemoglobin W0J     Status: Abnormal   Collection Time: 05/11/16  2:20 PM  Result Value Ref Range   Hgb A1c MFr Bld 9.4 (H) <5.7 %    Comment:   For someone without known diabetes, a hemoglobin A1c value of 6.5% or greater  indicates that they may have diabetes and this should be confirmed with a follow-up test.   For someone with known diabetes, a value <7% indicates that their diabetes is well controlled and a value greater than or equal to 7% indicates suboptimal control. A1c targets should be individualized based on duration of diabetes, age, comorbid conditions, and other considerations.   Currently, no consensus exists for use of hemoglobin A1c for diagnosis of diabetes for children.      Mean Plasma Glucose 223 mg/dL      Assessment & Plan:   1. Uncontrolled type 2 diabetes mellitus with complication, without long-term current use of insulin (HCC)   - Patient has currently uncontrolled symptomatic type 2 DM since  66 years of age. - She came with near target blood glucose profile with some fasting readings in 70s and 80s. -  Her most recent A1c was high at 9.4% increasing from 7.9 %.    She does not report gross complications from diabetes, however she remains at a high risk for more acute and chronic complications of diabetes which include CAD, CVA, CKD, retinopathy, and neuropathy. These are all discussed in detail with the patient.  - I have counseled the patient on diet management and weight loss, by adopting a carbohydrate restricted/protein rich diet.  - Suggestion is made for patient to avoid simple carbohydrates   from their diet including Cakes , Desserts, Ice Cream,  Soda (  diet and regular) , Sweet Tea , Candies,  Chips, Cookies, Artificial Sweeteners,   and "Sugar-free" Products . This will help patient to have stable blood glucose profile and potentially avoid unintended weight gain.  - I encouraged the patient to switch to  unprocessed or minimally processed complex starch and increased protein intake (animal or plant source), fruits, and vegetables.  - Patient is advised to stick to a routine mealtimes to eat 3 meals  a day and avoid unnecessary snacks ( to snack only to correct  hypoglycemia).  - The patient will be scheduled with Norm Salt, RDN, CDE for individualized DM education.  - I have approached patient with the following individualized plan to manage diabetes and patient agrees:   - I  will proceed to  readjust her basal insulin Lantus to 30 units daily at bedtime,  Continue monitoring of glucose 2 times a day- daily before breakfast and at bedtime.   -Patient is encouraged to call clinic for blood glucose levels less than 70 or above 300 mg /dl. - I will continue  metformin 500 mg  by mouth twice a day, and will increase Trulicity to 1.5 mg subcutaneously weekly therapeutically suitable for patient.  - Patient specific target  A1c;  LDL, HDL, Triglycerides, and  Waist Circumference were discussed in detail.  2) BP/HTN:  controlled. Continue current medications including ACEI/ARB. 3) Lipids/HPL:  Her LDL was 141 on 01/21/2015.  She is not on the statin, reports statin intolerance from prior attempt.  I advised her to continue gemfibrozil 600 mg by mouth twice a day.  4)  Weight/Diet: CDE Consult  has been  initiated , exercise, and detailed carbohydrates information provided.  5) Chronic Care/Health Maintenance:  -Patient is on ARB and initiated on  Statin medications and encouraged to continue to follow up with Ophthalmology, Podiatrist at least yearly or according to recommendations, and advised to   stay away from smoking. I have recommended yearly flu vaccine and pneumonia vaccination at least every 5 years; moderate intensity exercise for up to 150 minutes weekly; and  sleep for at least 7 hours a day.  - 30 minutes of time was spent on the care of this patient , 50% of which was applied for counseling on diabetes complications and their preventions.  - Patient to bring meter and  blood glucose logs during her next visit.   - I advised patient to maintain close follow up with Arlina Robes, MD for primary care needs.  Follow up plan: -  Return in about 3 months (around 09/09/2016) for follow up with pre-visit labs, meter, and logs.  Marquis Lunch, MD Phone: [phone]  Fax: [phone]   06/09/2016, 10:09 AM

## 2016-06-09 NOTE — Telephone Encounter (Signed)
Her Lantus has already been changed to [place] several hours ago.

## 2016-07-31 ENCOUNTER — Telehealth: Payer: Self-pay | Admitting: "Endocrinology

## 2016-07-31 MED ORDER — PEN NEEDLES 31G X 5 MM MISC: 1.0000 | Freq: Every day | 3 refills | Status: AC

## 2016-07-31 NOTE — Telephone Encounter (Signed)
Rx sent 

## 2016-07-31 NOTE — Telephone Encounter (Signed)
Linda Davis is asking for a Rx for needles for her insulin pen sent to [facility] [place]

## 2016-09-05 ENCOUNTER — Other Ambulatory Visit: Payer: Self-pay | Admitting: "Endocrinology

## 2016-09-08 ENCOUNTER — Ambulatory Visit: Payer: Medicare Other | Admitting: "Endocrinology

## 2016-09-08 ENCOUNTER — Ambulatory Visit: Payer: Medicare Other | Admitting: Nutrition

## 2016-09-13 ENCOUNTER — Other Ambulatory Visit: Payer: Self-pay | Admitting: "Endocrinology

## 2016-10-12 LAB — BASIC METABOLIC PANEL
BUN: 14 (ref 4–21)
CREATININE: 1 (ref 0.5–1.1)

## 2016-10-12 LAB — HEMOGLOBIN A1C: HEMOGLOBIN A1C: 6.2

## 2016-10-15 ENCOUNTER — Ambulatory Visit (INDEPENDENT_AMBULATORY_CARE_PROVIDER_SITE_OTHER): Payer: Medicare Other | Admitting: "Endocrinology

## 2016-10-15 ENCOUNTER — Encounter: Payer: Self-pay | Admitting: "Endocrinology

## 2016-10-15 ENCOUNTER — Encounter: Payer: Medicare Other | Attending: Family Medicine | Admitting: Nutrition

## 2016-10-15 VITALS — Ht 62.0 in | Wt 157.0 lb

## 2016-10-15 VITALS — BP 125/80 | HR 78 | Ht 65.0 in | Wt 157.0 lb

## 2016-10-15 DIAGNOSIS — I10 Essential (primary) hypertension: Secondary | ICD-10-CM | POA: Diagnosis not present

## 2016-10-15 DIAGNOSIS — E118 Type 2 diabetes mellitus with unspecified complications: Secondary | ICD-10-CM | POA: Insufficient documentation

## 2016-10-15 DIAGNOSIS — IMO0002 Reserved for concepts with insufficient information to code with codable children: Secondary | ICD-10-CM

## 2016-10-15 DIAGNOSIS — Z713 Dietary counseling and surveillance: Secondary | ICD-10-CM | POA: Diagnosis not present

## 2016-10-15 DIAGNOSIS — E782 Mixed hyperlipidemia: Secondary | ICD-10-CM

## 2016-10-15 DIAGNOSIS — E1165 Type 2 diabetes mellitus with hyperglycemia: Secondary | ICD-10-CM | POA: Insufficient documentation

## 2016-10-15 DIAGNOSIS — E119 Type 2 diabetes mellitus without complications: Secondary | ICD-10-CM

## 2016-10-15 MED ORDER — INSULIN DEGLUDEC 100 UNIT/ML ~~LOC~~ SOPN: 20.0000 [IU] | PEN_INJECTOR | Freq: Every day | SUBCUTANEOUS | 2 refills | Status: DC

## 2016-10-15 NOTE — Patient Instructions (Addendum)
Goals Keep up the great job!!!   1. Add protein with breakfast 2. Eat 2-3 carb choices per meal 3. BS should be 100 before going to bed 4. Keep exersing. Avoid low blood sugars Talk to Dr. Fransico HimNIda about the Libra CGM.

## 2016-10-15 NOTE — Progress Notes (Signed)
Subjective:    Patient ID: Linda Davis, female    DOB: May 11, 1950. Patient is being seen in Follow-up for management of diabetes requested by  Arlina Robes, MD  Past Medical History:  Diagnosis Date  . Arthritis   . Diabetes mellitus   . Hypertension   . Seasonal allergies    Past Surgical History:  Procedure Laterality Date  . ABDOMINAL HYSTERECTOMY    . BACK SURGERY     x4  . BRONCHOSCOPY    . CARPAL TUNNEL RELEASE     both rt/lt  . CARPECTOMY  07/01/2011   Procedure: CARPECTOMY;  Surgeon: Nicki Reaper, MD;  Location: [facility];  Service: Orthopedics;  Laterality: Left;  PROXIMAL ROW CARPECTOMY LEFT WIRST,styloidectomy  . COLONOSCOPY    . HAND TENDON SURGERY     rt   . LUMBAR FUSION  2006,2007,1985,1990  . THORACOTOMY  1995   rt-scar tissue from pneumonia   Social History   Social History  . Marital status: Married    Spouse name: N/A  . Number of children: N/A  . Years of education: N/A   Social History Main Topics  . Smoking status: Current Every Day Smoker    Packs/day: 0.25  . Smokeless tobacco: Never Used  . Alcohol use No  . Drug use: No  . Sexual activity: Not on file   Other Topics Concern  . Not on file   Social History Narrative  . No narrative on file   Outpatient Encounter Prescriptions as of 10/15/2016  Medication Sig  . aspirin EC 81 MG tablet Take 81 mg by mouth daily.  Marland Kitchen atorvastatin (LIPITOR) 20 MG tablet TAKE 1 TABLET BY MOUTH EVERY DAY  . clopidogrel (PLAVIX) 75 MG tablet Take 75 mg by mouth daily.  . Dulaglutide (TRULICITY) 1.5 MG/0.5ML SOPN Inject 1.5 mg into the skin once a week.  . insulin degludec (TRESIBA FLEXTOUCH) 100 UNIT/ML SOPN FlexTouch Pen Inject 0.2 mLs (20 Units total) into the skin daily at 10 pm.  . Insulin Pen Needle (PEN NEEDLES) 31G X 5 MM MISC 1 each by Does not apply route at bedtime.  Marland Kitchen linaclotide (LINZESS) 290 MCG CAPS capsule Take 290 mcg by mouth daily before breakfast.  .  losartan-hydrochlorothiazide (HYZAAR) 100-12.5 MG per tablet Take 1 tablet by mouth daily.  . metFORMIN (GLUCOPHAGE) 500 MG tablet Take 500 mg by mouth 2 (two) times daily with a meal.  . Multiple Vitamin (MULTIVITAMIN) capsule Take 1 capsule by mouth daily.  . [DISCONTINUED] TRESIBA FLEXTOUCH 100 UNIT/ML SOPN FlexTouch Pen INJECT 0.3 MLS (30 UNITS TOTAL) INTO THE SKIN DAILY AT 10 PM.   No facility-administered encounter medications on file as of 10/15/2016.    ALLERGIES: Allergies  Allergen Reactions  . Morphine And Related Swelling   VACCINATION STATUS:  There is no immunization history on file for this patient.  Diabetes  She presents for her follow-up diabetic visit. She has type 2 diabetes mellitus. Onset time: She was diagnosed at approximate age of 66 years. Her disease course has been improving. There are no hypoglycemic associated symptoms. Pertinent negatives for hypoglycemia include no confusion, headaches, pallor or seizures. Associated symptoms include polyuria. Pertinent negatives for diabetes include no blurred vision, no chest pain, no fatigue, no polydipsia and no polyphagia. There are no hypoglycemic complications. Symptoms are improving. There are no diabetic complications. Risk factors for coronary artery disease include diabetes mellitus, dyslipidemia, hypertension, sedentary lifestyle and tobacco exposure. Current diabetic treatment  includes oral agent (dual therapy) (She is currently on metformin 1000 g by mouth twice a day, glimepiride 4 mg by mouth daily, trulicity 1.5 mg weekly.). Her weight is stable. She is following a generally unhealthy diet. When asked about meal planning, she reported none. She has not had a previous visit with a dietitian. She rarely participates in exercise. Her breakfast blood glucose range is generally 180-200 mg/dl. Her lunch blood glucose range is generally 180-200 mg/dl. Her dinner blood glucose range is generally 180-200 mg/dl. Her overall  blood glucose range is 180-200 mg/dl. ( ) An ACE inhibitor/angiotensin II receptor blocker is being taken. She sees a podiatrist.Eye exam is current.  Hyperlipidemia  This is a chronic problem. The current episode started more than 1 year ago. The problem is uncontrolled. Recent lipid tests were reviewed and are variable. Exacerbating diseases include diabetes. Pertinent negatives include no chest pain, myalgias or shortness of breath. Current antihyperlipidemic treatment includes fibric acid derivatives. Risk factors for coronary artery disease include diabetes mellitus, dyslipidemia, hypertension and a sedentary lifestyle.  Hypertension  This is a chronic problem. The current episode started more than 1 year ago. The problem is uncontrolled. Pertinent negatives include no blurred vision, chest pain, headaches, palpitations or shortness of breath. Risk factors for coronary artery disease include dyslipidemia, diabetes mellitus, smoking/tobacco exposure and sedentary lifestyle. Past treatments include angiotensin blockers.    Review of Systems  Constitutional: Negative for chills, fatigue, fever and unexpected weight change.  HENT: Negative for trouble swallowing and voice change.   Eyes: Negative for blurred vision and visual disturbance.  Respiratory: Negative for cough, shortness of breath and wheezing.   Cardiovascular: Negative for chest pain, palpitations and leg swelling.  Gastrointestinal: Negative for diarrhea, nausea and vomiting.  Endocrine: Positive for polyuria. Negative for cold intolerance, heat intolerance, polydipsia and polyphagia.  Musculoskeletal: Negative for arthralgias and myalgias.  Skin: Negative for color change, pallor, rash and wound.  Neurological: Negative for seizures and headaches.  Psychiatric/Behavioral: Negative for confusion and suicidal ideas.    Objective:    BP 125/80   Pulse 78   Ht 5\' 5"  (1.651 m)   Wt 157 lb (71.2 kg)   BMI 26.13 kg/m   Wt  Readings from Last 3 Encounters:  10/15/16 157 lb (71.2 kg)  10/15/16 157 lb (71.2 kg)  06/09/16 164 lb (74.4 kg)    Physical Exam  Constitutional: She is oriented to person, place, and time. She appears well-developed.  HENT:  Head: Normocephalic and atraumatic.  Eyes: EOM are normal.  Neck: Normal range of motion. Neck supple. No tracheal deviation present. No thyromegaly present.  Cardiovascular: Normal rate and regular rhythm.   Pulmonary/Chest: Effort normal and breath sounds normal.  Abdominal: Soft. Bowel sounds are normal. There is no tenderness. There is no guarding.  Musculoskeletal: Normal range of motion. She exhibits no edema.  Neurological: She is alert and oriented to person, place, and time. She has normal reflexes. No cranial nerve deficit. Coordination normal.  Skin: Skin is warm and dry. No rash noted. No erythema. No pallor.  Psychiatric: She has a normal mood and affect. Judgment normal.   Recent Results (from the past 2160 hour(s))  Basic metabolic panel     Status: None   Collection Time: 10/12/16 12:00 AM  Result Value Ref Range   BUN 14 4 - 21   Creatinine 1.0 0.5 - 1.1  Hemoglobin A1c     Status: None   Collection Time: 10/12/16 12:00 AM  Result Value Ref Range   Hemoglobin A1C 6.2       Assessment & Plan:   1. Uncontrolled type 2 diabetes mellitus with complication, without long-term current use of insulin (HCC) - Patient has currently uncontrolled symptomatic type 2 DM since  66 years of age. - She came with near target blood glucose profile with some fasting readings in 70s and 80s. -  Her most recent A1c has Improved to 6.2% from  9.4%.    She does not report gross complications from diabetes, however she remains at a high risk for more acute and chronic complications of diabetes which include CAD, CVA, CKD, retinopathy, and neuropathy. These are all discussed in detail with the patient.  - I have counseled the patient on diet management and  weight loss, by adopting a carbohydrate restricted/protein rich diet.  - Suggestion is made for patient to avoid simple carbohydrates   from her diet including Cakes , Desserts, Ice Cream,  Soda (  diet and regular) , Sweet Tea , Candies,  Chips, Cookies, Artificial Sweeteners,   and "Sugar-free" Products . This will help patient to have stable blood glucose profile and potentially avoid unintended weight gain.  - I encouraged the patient to switch to  unprocessed or minimally processed complex starch and increased protein intake (animal or plant source), fruits, and vegetables.  - Patient is advised to stick to a routine mealtimes to eat 3 meals  a day and avoid unnecessary snacks ( to snack only to correct hypoglycemia).  - The patient will be scheduled with Norm SaltPenny Crumpton, RDN, CDE for individualized DM education.  - I have approached patient with the following individualized plan to manage diabetes and patient agrees:   - I  will proceed to  readjust her basal insulin Lantus to 20 units daily at bedtime,  Continue monitoring of glucose 2 times a day- daily before breakfast and at bedtime.   -Patient is encouraged to call clinic for blood glucose levels less than 70 or above 300 mg /dl. - I will continue  metformin 500 mg by mouth twice a day, and will increase Trulicity to 1.5 mg subcutaneously weekly therapeutically suitable for patient.  - Patient specific target  A1c;  LDL, HDL, Triglycerides, and  Waist Circumference were discussed in detail.  2) BP/HTN:  controlled. Continue current medications including ACEI/ARB. 3) Lipids/HPL:  Her LDL was 141 on 01/21/2015.  She is not on the statin, reports statin intolerance from prior attempt.  I advised her to continue gemfibrozil 600 mg by mouth twice a day.  4)  Weight/Diet: CDE Consult  has been  initiated , exercise, and detailed carbohydrates information provided.  5) Chronic Care/Health Maintenance:  -Patient is on ARB and initiated on   Statin medications and encouraged to continue to follow up with Ophthalmology, Podiatrist at least yearly or according to recommendations, and advised to   stay away from smoking. I have recommended yearly flu vaccine and pneumonia vaccination at least every 5 years; moderate intensity exercise for up to 150 minutes weekly; and  sleep for at least 7 hours a day.  - 20 minutes of time was spent on the care of this patient , 50% of which was applied for counseling on diabetes complications and their preventions.  - Patient to bring meter and  blood glucose logs during her next visit.   - I advised patient to maintain close follow up with Arlina RobesWinfield, Albert Carl, MD for primary care needs.  Follow up  plan: - Return in about 3 months (around 01/15/2017) for follow up with pre-visit labs, meter, and logs.  Marquis LunchGebre Shikita Vaillancourt, MD Phone: [phone]  Fax: [phone]   10/15/2016, 9:59 AM

## 2016-10-15 NOTE — Patient Instructions (Signed)

## 2016-10-15 NOTE — Progress Notes (Signed)
Diabetes Self-Management Education  Visit Type:    Appt. Start Time: 1030 Appt. End Time: 1130  10/15/2016  [name], identified by name and date of birth, is a 66 y.o. female with a diagnosis of Diabetes:  . Follow up   Has been exercising by walking most days of week, rides exercising bikes. BS log brought in. A1C is down to 6.2% from > 9%. She feels much better. Lost 7 lbs. Avoiding skipping meal.Has had a few low blood sugars in the 70's and below. Treats with juice, soda or mints. Keeps snack with her all the time       She is caring for her husband who has dementia. Lantus 30 units, Trulicity and Metformin 500 mg BID.   Lab Results  Component Value Date   HGBA1C 6.2 10/12/2016    CMP Latest Ref Rng & Units 10/12/2016 05/11/2016 07/01/2011  Glucose 65 - 99 mg/dL - 161(W256(H) 960(A227(H)  BUN 4 - 21 14 18 11   Creatinine 0.5 - 1.1 1.0 1.27(H) 0.80  Sodium 135 - 146 mmol/L - 137 138  Potassium 3.5 - 5.3 mmol/L - 4.3 4.1  Chloride 98 - 110 mmol/L - 101 103  CO2 20 - 31 mmol/L - 27 -  Calcium 8.6 - 10.4 mg/dL - 9.8 -  Total Protein 6.1 - 8.1 g/dL - 7.2 -  Total Bilirubin 0.2 - 1.2 mg/dL - 0.3 -  Alkaline Phos 33 - 130 U/L - 70 -  AST 10 - 35 U/L - 19 -  ALT 6 - 29 U/L - 14 -   Lipid Panel     Component Value Date/Time   LDLCALC 141 01/21/2015    B) Oatmeal- plain 1 cup, or raisin bran 1 cup or smoothie L)  Toss salad with grilled chicken, [place] dressing, D) CHicken alfredo, garlic toast, broccoli, corn,peas, carrots,     Individualized Plan for Diabetes Self-Management Training:   Learning Objective:  Patient will have a greater understanding of diabetes self-management. Patient education plan is to attend individual and/or group sessions per assessed needs and concerns.   Plan:   Goals Keep up the great job!!!   1. Add protein with breakfast 2. Eat 2-3 carb choices per meal 3. BS should be 100 before going to bed 4. Keep exersing. Avoid low blood  sugars Talk to Dr. Fransico HimNIda about the Libra CGM.  Expected Outcomes:     Education material provided: Living Well with DM, Plate Method, Meal Plan Card  If problems or questions, patient to contact team via:  Phone and Email  Future DSME appointment:   3 months

## 2016-10-26 ENCOUNTER — Other Ambulatory Visit: Payer: Self-pay | Admitting: *Deleted

## 2016-10-26 NOTE — Telephone Encounter (Signed)
Rx Request to increase Meds to 90 day supply

## 2016-11-02 ENCOUNTER — Other Ambulatory Visit: Payer: Self-pay

## 2016-11-02 MED ORDER — INSULIN PEN NEEDLE 31G X 5 MM MISC: 2 refills | Status: AC

## 2016-11-02 MED ORDER — DULAGLUTIDE 1.5 MG/0.5ML ~~LOC~~ SOAJ: 1.5000 mg | SUBCUTANEOUS | 0 refills | Status: DC

## 2016-11-02 MED ORDER — ATORVASTATIN CALCIUM 20 MG PO TABS: 20.0000 mg | ORAL_TABLET | Freq: Every day | ORAL | 0 refills | Status: AC

## 2016-11-02 MED ORDER — INSULIN DEGLUDEC 100 UNIT/ML ~~LOC~~ SOPN: 30.0000 [IU] | PEN_INJECTOR | Freq: Every day | SUBCUTANEOUS | 0 refills | Status: AC

## 2017-01-19 ENCOUNTER — Ambulatory Visit: Payer: Medicare Other | Admitting: "Endocrinology

## 2017-01-19 ENCOUNTER — Ambulatory Visit: Payer: Medicare Other | Admitting: Nutrition

## 2017-01-23 ENCOUNTER — Other Ambulatory Visit: Payer: Self-pay | Admitting: "Endocrinology

## 2017-02-09 ENCOUNTER — Other Ambulatory Visit: Payer: Self-pay

## 2017-02-09 MED ORDER — DULAGLUTIDE 1.5 MG/0.5ML ~~LOC~~ SOAJ: 1.5000 mg | SUBCUTANEOUS | 0 refills | Status: DC

## 2017-02-10 ENCOUNTER — Other Ambulatory Visit: Payer: Self-pay

## 2017-02-10 MED ORDER — DULAGLUTIDE 1.5 MG/0.5ML ~~LOC~~ SOAJ: 1.5000 mg | SUBCUTANEOUS | 0 refills | Status: AC

## 2017-03-19 ENCOUNTER — Other Ambulatory Visit: Payer: Self-pay

## 2017-03-25 ENCOUNTER — Other Ambulatory Visit: Payer: Self-pay
